# Patient Record
Sex: Female | Born: 1955 | Race: White | Hispanic: No | Marital: Married | Smoking: Never smoker
Health system: Southern US, Community
[De-identification: ages and names within clinical notes are randomized; demographics above are authoritative.]

## PROBLEM LIST (undated history)

## (undated) DIAGNOSIS — E119 Type 2 diabetes mellitus without complications: Secondary | ICD-10-CM

## (undated) DIAGNOSIS — I10 Essential (primary) hypertension: Secondary | ICD-10-CM

## (undated) DIAGNOSIS — Z1239 Encounter for other screening for malignant neoplasm of breast: Secondary | ICD-10-CM

## (undated) DIAGNOSIS — J4520 Mild intermittent asthma, uncomplicated: Secondary | ICD-10-CM

## (undated) DIAGNOSIS — L930 Discoid lupus erythematosus: Secondary | ICD-10-CM

## (undated) DIAGNOSIS — Z1231 Encounter for screening mammogram for malignant neoplasm of breast: Secondary | ICD-10-CM

## (undated) DIAGNOSIS — R399 Unspecified symptoms and signs involving the genitourinary system: Secondary | ICD-10-CM

## (undated) DIAGNOSIS — R0602 Shortness of breath: Secondary | ICD-10-CM

## (undated) DIAGNOSIS — M797 Fibromyalgia: Secondary | ICD-10-CM

## (undated) DIAGNOSIS — E559 Vitamin D deficiency, unspecified: Secondary | ICD-10-CM

## (undated) DIAGNOSIS — M329 Systemic lupus erythematosus, unspecified: Secondary | ICD-10-CM

## (undated) DIAGNOSIS — K5090 Crohn's disease, unspecified, without complications: Secondary | ICD-10-CM

## (undated) DIAGNOSIS — IMO0002 Reserved for concepts with insufficient information to code with codable children: Secondary | ICD-10-CM

## (undated) HISTORY — DX: Systemic lupus erythematosus, unspecified: M32.9

## (undated) HISTORY — PX: VASCULAR SURGERY: SHX849

## (undated) HISTORY — PX: ABDOMINAL SURGERY: SHX537

---

## 2006-07-23 ENCOUNTER — Ambulatory Visit: Payer: Self-pay | Admitting: Internal Medicine

## 2006-07-23 LAB — CONVERTED CEMR LAB
BUN: 15 mg/dL (ref 6–23)
Basophils Absolute: 0.1 10*3/uL (ref 0.0–0.1)
Basophils Relative: 0.8 % (ref 0.0–1.0)
CO2: 26 meq/L (ref 19–32)
Calcium: 9.3 mg/dL (ref 8.4–10.5)
Chloride: 103 meq/L (ref 96–112)
Creatinine, Ser: 0.7 mg/dL (ref 0.4–1.2)
Eosinophil percent: 2.5 % (ref 0.0–5.0)
GFR calc non Af Amer: 95 mL/min
Glomerular Filtration Rate, Af Am: 115 mL/min/{1.73_m2}
Glucose, Bld: 93 mg/dL (ref 70–99)
HCT: 38.4 % (ref 36.0–46.0)
Hemoglobin: 12.7 g/dL (ref 12.0–15.0)
Hgb A1c MFr Bld: 5.9 % (ref 4.6–6.0)
Lymphocytes Relative: 23.1 % (ref 12.0–46.0)
MCHC: 33.1 g/dL (ref 30.0–36.0)
MCV: 94.4 fL (ref 78.0–100.0)
Monocytes Absolute: 0.3 10*3/uL (ref 0.2–0.7)
Monocytes Relative: 4.3 % (ref 3.0–11.0)
Neutro Abs: 5.6 10*3/uL (ref 1.4–7.7)
Neutrophils Relative %: 69.3 % (ref 43.0–77.0)
Platelets: 318 10*3/uL (ref 150–400)
Potassium: 4.1 meq/L (ref 3.5–5.1)
RBC: 4.07 M/uL (ref 3.87–5.11)
RDW: 12.7 % (ref 11.5–14.6)
Sodium: 138 meq/L (ref 135–145)
TSH: 1.14 microintl units/mL (ref 0.35–5.50)
WBC: 8 10*3/uL (ref 4.5–10.5)

## 2006-09-23 ENCOUNTER — Ambulatory Visit: Payer: Self-pay | Admitting: Internal Medicine

## 2006-09-23 LAB — CONVERTED CEMR LAB
ALT: 20 units/L (ref 0–40)
AST: 19 units/L (ref 0–37)
Albumin: 4 g/dL (ref 3.5–5.2)
Alkaline Phosphatase: 49 units/L (ref 39–117)
BUN: 13 mg/dL (ref 6–23)
Bilirubin, Direct: 0.1 mg/dL (ref 0.0–0.3)
CO2: 25 meq/L (ref 19–32)
Calcium: 9.1 mg/dL (ref 8.4–10.5)
Chloride: 104 meq/L (ref 96–112)
Chol/HDL Ratio, serum: 4.4
Cholesterol: 229 mg/dL (ref 0–200)
Creatinine, Ser: 0.5 mg/dL (ref 0.4–1.2)
GFR calc non Af Amer: 140 mL/min
Glomerular Filtration Rate, Af Am: 169 mL/min/{1.73_m2}
Glucose, Bld: 72 mg/dL (ref 70–99)
HDL: 51.9 mg/dL (ref >39.0)
LDL DIRECT: 152.4 mg/dL
Potassium: 4.1 meq/L (ref 3.5–5.1)
Sodium: 137 meq/L (ref 135–145)
Total Bilirubin: 0.6 mg/dL (ref 0.3–1.2)
Total Protein: 6.2 g/dL (ref 6.0–8.3)
Triglyceride fasting, serum: 221 mg/dL (ref 0–149)
VLDL: 44 mg/dL — ABNORMAL HIGH (ref 0–40)

## 2006-10-09 ENCOUNTER — Ambulatory Visit: Payer: Self-pay

## 2006-11-04 ENCOUNTER — Ambulatory Visit: Payer: Self-pay | Admitting: Internal Medicine

## 2006-11-27 ENCOUNTER — Ambulatory Visit (HOSPITAL_BASED_OUTPATIENT_CLINIC_OR_DEPARTMENT_OTHER): Admission: RE | Admit: 2006-11-27 | Discharge: 2006-11-27 | Payer: Self-pay | Admitting: Internal Medicine

## 2006-12-09 ENCOUNTER — Ambulatory Visit: Payer: Self-pay | Admitting: Pulmonary Disease

## 2006-12-21 DIAGNOSIS — F329 Major depressive disorder, single episode, unspecified: Secondary | ICD-10-CM

## 2006-12-21 DIAGNOSIS — F411 Generalized anxiety disorder: Secondary | ICD-10-CM | POA: Insufficient documentation

## 2006-12-21 DIAGNOSIS — E039 Hypothyroidism, unspecified: Secondary | ICD-10-CM | POA: Insufficient documentation

## 2006-12-22 ENCOUNTER — Telehealth: Payer: Self-pay | Admitting: Internal Medicine

## 2007-03-12 ENCOUNTER — Encounter: Admission: RE | Admit: 2007-03-12 | Discharge: 2007-03-12 | Payer: Self-pay | Admitting: Obstetrics and Gynecology

## 2007-03-16 DIAGNOSIS — K5090 Crohn's disease, unspecified, without complications: Secondary | ICD-10-CM | POA: Insufficient documentation

## 2007-07-16 ENCOUNTER — Other Ambulatory Visit: Admission: RE | Admit: 2007-07-16 | Discharge: 2007-07-16 | Payer: Self-pay | Admitting: Obstetrics and Gynecology

## 2007-08-18 ENCOUNTER — Encounter: Admission: RE | Admit: 2007-08-18 | Discharge: 2007-08-18 | Payer: Self-pay | Admitting: Obstetrics and Gynecology

## 2007-12-09 ENCOUNTER — Emergency Department (HOSPITAL_COMMUNITY): Admission: EM | Admit: 2007-12-09 | Discharge: 2007-12-09 | Payer: Self-pay | Admitting: Emergency Medicine

## 2007-12-10 ENCOUNTER — Ambulatory Visit (HOSPITAL_COMMUNITY): Admission: RE | Admit: 2007-12-10 | Discharge: 2007-12-10 | Payer: Self-pay | Admitting: Emergency Medicine

## 2007-12-18 ENCOUNTER — Encounter: Admission: RE | Admit: 2007-12-18 | Discharge: 2007-12-18 | Payer: Self-pay | Admitting: Gastroenterology

## 2007-12-23 ENCOUNTER — Encounter: Admission: RE | Admit: 2007-12-23 | Discharge: 2007-12-23 | Payer: Self-pay | Admitting: Gastroenterology

## 2007-12-28 ENCOUNTER — Encounter: Admission: RE | Admit: 2007-12-28 | Discharge: 2008-03-27 | Payer: Self-pay | Admitting: Endocrinology

## 2008-01-20 ENCOUNTER — Ambulatory Visit (HOSPITAL_COMMUNITY): Admission: RE | Admit: 2008-01-20 | Discharge: 2008-01-20 | Payer: Self-pay | Admitting: Gastroenterology

## 2008-03-22 ENCOUNTER — Encounter: Admission: RE | Admit: 2008-03-22 | Discharge: 2008-06-07 | Payer: Self-pay | Admitting: Endocrinology

## 2008-07-08 ENCOUNTER — Encounter: Admission: RE | Admit: 2008-07-08 | Discharge: 2008-07-08 | Payer: Self-pay | Admitting: Gastroenterology

## 2008-08-18 ENCOUNTER — Encounter: Admission: RE | Admit: 2008-08-18 | Discharge: 2008-08-18 | Payer: Self-pay | Admitting: Obstetrics and Gynecology

## 2008-09-01 IMAGING — NM NM HEPATO W/GB/PHARM/[PERSON_NAME]
3 series · 8 of 8 positions shown · non-contrast
Comparison: None.

CLINICAL DATA: Abdominal pain radiating to the shoulders.  Nausea.

NUCLEAR MEDICINE HEPATOBILIARY IMAGING WITH/WITHOUT GALLBLADDER
EJECTION FRACTION.
TECHNIQUE: Sequential abdominal images were obtained following
intravenous injection of radiopharmaceutical.  Sequential images
were continued following oral ingestion of 8 oz. half-and-half, and
the gallbladder ejection fraction was calculated.
Radiopharmaceutical: 5.0 mCi technetium 99m Choletec

[gb hepatobiliary · 4.66mm/px · 6 of 12 frames shown (1 of 3)]
[frame 2/12]
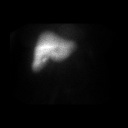
[frame 4/12]
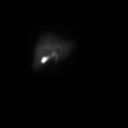
[frame 6/12]
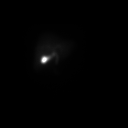
[frame 8/12]
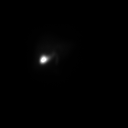
[frame 10/12]
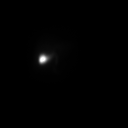
[frame 12/12]
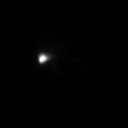

[gb hepatobiliary · 1 of 1 slices shown (2 of 3)]
[im 1/1]
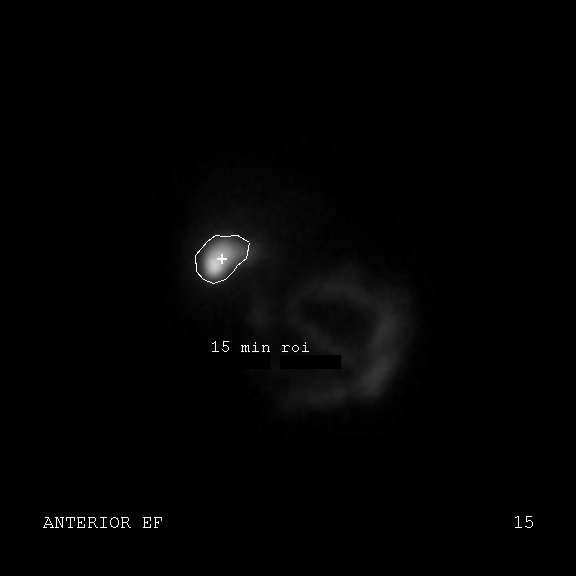

[gb hepatobiliary · 1 of 1 slices shown (3 of 3)]
[im 1/1]
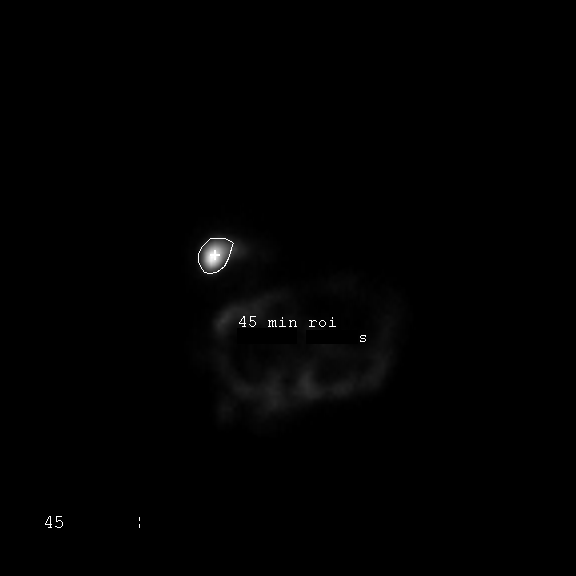

[8 of 8 positions shown; findings below may reference images not displayed]

FINDINGS: There is brisk uptake of radiotracer by the hepatic
sites.  Blood pool activity has essentially been cleared by 10
minutes.  Biliary, common duct, and gallbladder activity is seen on
the 10-minute film.  Bowel activity is seen by 20 minutes.

Region of interest measurements were obtained at the level of the
gallbladder and monitored after administration of oral half-and-
half.  At 45 minutes, gallbladder ejection fraction had achieved
69%.
IMPRESSION: Normal hepatobiliary patency scan.

Normal gallbladder ejection fraction.

## 2009-04-06 ENCOUNTER — Emergency Department (HOSPITAL_COMMUNITY): Admission: EM | Admit: 2009-04-06 | Discharge: 2009-04-06 | Payer: Self-pay | Admitting: Emergency Medicine
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
## 2011-02-01 NOTE — Procedures (Signed)
Amanda Gilbert, PETRENKO NO.:  [id number]   MEDICAL RECORD NO.:  [id number]          PATIENT TYPE:  OUT   LOCATION:  SLEEP CENTER                 FACILITY:  [facility]   PHYSICIAN:  Barbaraann Share, MD,FCCPDATE OF BIRTH:  1958-03-[birth year]   DATE OF STUDY:  11/27/2006                            NOCTURNAL POLYSOMNOGRAM   LOCATION:  Sleep lab.   REFERRING PHYSICIAN:  Valetta Mole. Swords, M.D.   INDICATION FOR STUDY:  Other sleep disturbance.  Also, persistent  disorder of initiating and maintaining wakefulness.   EPWORTH SLEEPINESS SCORE:  13.   MEDICATIONS:   SLEEP ARCHITECTURE:  The patient had a total sleep time of 381 minutes  with significantly decreased slow wave sleep  but adequate REM.  Sleep  onset latency was at the upper limits of normal.  REM onset was  prolonged at 208 minutes.  Sleep efficiency was surprisingly adequate at  94%.   RESPIRATORY DATA:  Patient was found to have 6 obstructive hypopneas and  2 obstructive apneas for an apnea-hypopnea index of only 1.3 events per  hour.  There was loud to very loud snoring noted throughout as well as  very large numbers of nonspecific arousals.  Split night study was not  done secondary to the small numbers of events.   OXYGEN DATA:  There was an O2 desaturation as low as 89% with the  patient's obstructive events.   CARDIAC DATA:  Rare PVCs noted but none of clinical significance.   MOVEMENT-PARASOMNIA:  The patient was found to have 259 leg jerks with 8  per hour, resulting in arousal or awakening.   IMPRESSIONS-RECOMMENDATIONS:  1. Small numbers of obstructive events which do not meet the apnea-      hypopnea index criteria for the obstructive sleep apnea syndrome.      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  This is a pre sleep apnea condition, which can      also disrupt sleep.  2.  Very large numbers of leg jerks with significant sleep disruption.      Given the small numbers of obstructive events, this is likely due      to a primary movement disorder of sleep, such as the restless legs      syndrome or      periodic leg movement syndrome.  Clinical correlation is suggested      to exclude other etiologies for this.  This may be the patient's      primary disruption to her sleep.      Barbaraann Share, MD,FCCP  Diplomate, American Board of Sleep  Medicine  Electronically Signed     [facility]/MEDQ  D:  12/09/2006 18:46:02  T:  12/10/2006 01:46:43  Job:  [id number]

## 2011-06-10 LAB — COMPREHENSIVE METABOLIC PANEL
AST: 20
Albumin: 3.6
Alkaline Phosphatase: 45
Chloride: 105
GFR calc Af Amer: >60
Potassium: 3.7

## 2011-06-10 LAB — DIFFERENTIAL
Basophils Relative: 0
Eosinophils Absolute: 0.2
Lymphs Abs: 1.5
Monocytes Absolute: 0.6
Monocytes Relative: 7
Neutro Abs: 6.6

## 2011-06-10 LAB — CBC
HCT: 34.9 — ABNORMAL LOW
MCV: 92.9
WBC: 8.9

## 2011-06-10 LAB — URINALYSIS, ROUTINE W REFLEX MICROSCOPIC
Protein, ur: NEGATIVE
Specific Gravity, Urine: 1.023
Urobilinogen, UA: 1

## 2011-06-10 LAB — LIPASE, BLOOD: Lipase: 22

## 2014-07-22 ENCOUNTER — Ambulatory Visit: Attending: Family Medicine

## 2014-07-22 ENCOUNTER — Ambulatory Visit: Admit: 2014-07-22 | Discharge: 2014-07-22 | Payer: PRIVATE HEALTH INSURANCE | Attending: Family Medicine

## 2014-07-22 DIAGNOSIS — E119 Type 2 diabetes mellitus without complications: Secondary | ICD-10-CM

## 2014-07-22 MED ORDER — HYDROXYCHLOROQUINE 200 MG TAB
200 mg | ORAL_TABLET | Freq: Two times a day (BID) | ORAL | Status: DC
Start: 2014-07-22 — End: 2014-12-31

## 2014-07-22 MED ORDER — LISINOPRIL 40 MG TAB
40 mg | ORAL_TABLET | Freq: Every day | ORAL | Status: DC
Start: 2014-07-22 — End: 2015-04-14

## 2014-07-22 MED ORDER — METFORMIN SR 500 MG 24 HR TABLET
500 mg | ORAL_TABLET | ORAL | Status: DC
Start: 2014-07-22 — End: 2014-12-01

## 2014-07-22 MED ORDER — PRAMIPEXOLE 0.25 MG TAB
0.25 mg | ORAL_TABLET | Freq: Two times a day (BID) | ORAL | Status: DC
Start: 2014-07-22 — End: 2015-02-11

## 2014-07-22 MED ORDER — CITALOPRAM 20 MG TAB
20 mg | ORAL_TABLET | Freq: Every day | ORAL | Status: DC
Start: 2014-07-22 — End: 2014-10-18

## 2014-07-22 MED ORDER — ACETIC ACID 2 % EAR SOLN
2 % | Freq: Three times a day (TID) | OTIC | Status: AC
Start: 2014-07-22 — End: 2014-07-29

## 2014-07-22 MED ORDER — HYDROCHLOROTHIAZIDE 25 MG TAB
25 mg | ORAL_TABLET | Freq: Every day | ORAL | Status: DC
Start: 2014-07-22 — End: 2015-04-14

## 2014-07-22 NOTE — Progress Notes (Signed)
Reviewed pt information and medications in preparation for today's visit.  Body mass index is 36.51 kg/(m^2).

## 2014-07-22 NOTE — Progress Notes (Signed)
Chief Complaint   Patient presents with   ??? Establish Care   ??? Skin Problem     Itchy ears   ??? Medication Refill     she is a 58 y.o. year old female new to practice who presents to establish care. Patient with hx of DM2, HTN, autoimmune disease, restless leg syndrome and anxiety. Says diabetes has been well controlled, cholesterol "borderline." Has hx of allergies and complains of b/l ear irritation. Patient has been on high dose celexa for many years. She would like to decrease dose of this medication. Has tried to do so on her own with adverse effects. Release signed for medical records.    Patient Active Problem List   Diagnosis Code   ??? Diabetes mellitus type 2, controlled (HCC) E11.9   ??? Essential hypertension I10     Past Surgical History   Procedure Laterality Date   ??? Hx cesarean section  1989/1991   ??? Hx dilation and curettage  2013     History     Social History   ??? Marital Status: MARRIED     Spouse Name: N/A     Number of Children: N/A   ??? Years of Education: N/A     Occupational History   ??? Not on file.     Social History Main Topics   ??? Smoking status: Never Smoker    ??? Smokeless tobacco: Never Used   ??? Alcohol Use: 0.0 oz/week     0 Glasses of wine per week   ??? Drug Use: No   ??? Sexual Activity: Not on file     Other Topics Concern   ??? Not on file     Social History Narrative   ??? No narrative on file     Family History   Problem Relation Age of Onset   ??? Hypertension Mother    ??? Hypertension Father      Current Outpatient Prescriptions   Medication Sig   ??? metFORMIN ER (GLUCOPHAGE XR) 500 mg tablet Take two tablets twice daily   ??? hydrochlorothiazide (HYDRODIURIL) 25 mg tablet Take 1 Tab by mouth daily.   ??? lisinopril (PRINIVIL, ZESTRIL) 40 mg tablet Take 1 Tab by mouth daily.   ??? citalopram (CELEXA) 20 mg tablet Take 3 Tabs by mouth daily. Take 3 tablets by mouth once daily   ??? pramipexole (MIRAPEX) 0.25 mg tablet Take 1 Tab by mouth two (2) times a day. Indications: RESTLESS LEGS SYNDROME    ??? hydroxychloroquine (PLAQUENIL) 200 mg tablet Take 1 Tab by mouth two (2) times a day.   ??? acetic acid (VOSOL) 2 % otic solution Administer 4 Drops into each ear three (3) times daily for 7 days.     No current facility-administered medications for this visit.     Allergies   Allergen Reactions   ??? Reglan [Metoclopramide] Other (comments)     Spontaneous arm movements   ??? Diflucan [Fluconazole] Other (comments)     Elevated liver enzymes       Review of Systems:  Constitutional: feeling well, denies fatigue, malaise  HEENT: see HPI, Negative for acute hearing or vision changes  Respiratory: Negative for cough, wheezing or SOB  Cardiovascular: Negative for chest pain, dizziness or palpitations  Gastrointestinal: Negative for nausea or abdominal pain  Genital/urinary: Negative for dysuria or voiding dysfunction  Musculoskeletal: Negative for acute myalgia or arthralgia   Neurological: see HPI, Negative for HA, weakness or paresthesia  Skin: Negative for rash or  lesion  Psychlogical: Negative for depression or anxiety     Filed Vitals:    07/22/14 0915   BP: 119/48   Pulse: 87   Temp: 98.6 ??F (37 ??C)   TempSrc: Oral   Resp: 18   Height: 5\' 4"  (1.626 m)   Weight: 212 lb 12.8 oz (96.525 kg)   SpO2: 96%       Physical Examination:  General: Well developed, well nourished, in no acute distress  Head: Normocephalic, atraumatic  Eyes: Sclera clear, EOMI  Neck: Normal range of motion  Respiratory: Clear to auscultation bilaterally with symmetrical effort  Cardiovascular: Normal S1, S2, Regular rate and rhythm  Extremities: Full range of motion, Normal gait  Neurological: Normal strength and sensation. No focal deficits  Skin: Warm and dry, normal tone and turgor  Psychological: Active, alert and oriented. Affect appropriate     Angela Elliott was seen today for establish care, skin problem and medication refill.    Diagnoses and all orders for this visit:    Diabetes mellitus type 2, controlled (HCC)  Orders:   -     metFORMIN ER (GLUCOPHAGE XR) 500 mg tablet; Take two tablets twice daily  -     METABOLIC PANEL, COMPREHENSIVE; Future  -     LIPID PANEL; Future  -     HEMOGLOBIN A1C; Future  -     CBC W/O DIFF; Future  -     AMB POC URINALYSIS DIP STICK AUTO W/O MICRO  -     AMB POC URINE, MICROALBUMIN, SEMIQUANT (3 RESULTS)    Essential hypertension  Orders:  -     hydrochlorothiazide (HYDRODIURIL) 25 mg tablet; Take 1 Tab by mouth daily.  -     lisinopril (PRINIVIL, ZESTRIL) 40 mg tablet; Take 1 Tab by mouth daily.  -     METABOLIC PANEL, COMPREHENSIVE; Future  -     TSH, 3RD GENERATION; Future    Anxiety  Orders:  -     citalopram (CELEXA) 20 mg tablet; Take 3 Tabs by mouth daily. Take 3 tablets by mouth once daily    Restless leg syndrome  Orders:  -     pramipexole (MIRAPEX) 0.25 mg tablet; Take 1 Tab by mouth two (2) times a day. Indications: RESTLESS LEGS SYNDROME    Autoimmune disorder (HCC)  Orders:  -     hydroxychloroquine (PLAQUENIL) 200 mg tablet; Take 1 Tab by mouth two (2) times a day.    Otitis externa, left  Orders:  -     acetic acid (VOSOL) 2 % otic solution; Administer 4 Drops into each ear three (3) times daily for 7 days.     Will refer to rheumatology when records available.  Start to decrease celexa to 2 1/2 tabs every 3rd day for 10 days.  Then every other day for 10 days. Then daily until follow up appointment  I have discussed the diagnosis with the patient and the intended plan as seen in the above orders.The patient expresses understanding and agreement with our plan of care. All of the patient's questions were answered to apparent satisfaction. The patient has received an after-visit summary. The patient knows to call our office if there are any questions or concerns regarding diagnosis and treatment plans. I have discussed medication side effects and warnings with the patient as well.  Over half of the 45 minutes face to face with Milda Smartathy K Rayner consisted  of counseling, review of medical history, medication and discussing treatment  plans in reference to her multiple co-morbidities..      Follow-up Disposition:  Return in about 4 weeks (around 08/19/2014).

## 2014-07-22 NOTE — Patient Instructions (Signed)
Start to decrease celexa to 2 1/2 tabs every 3rd day for 10 days.  Then every other day for 10 days. Then daily until follow up appointment  Nutrition Tips for Diabetes: After Your Visit  Your Care Instructions  A healthy diet is important to manage diabetes. It helps you lose weight (if you need to) and keep it off. It gives you the nutrition and energy your body needs and helps prevent heart disease. But a diet for diabetes does not mean that you have to eat special foods. You can eat what your family eats, including occasional sweets and other favorites. But you do have to pay attention to how often you eat and how much you eat of certain foods. The right plan for you will give you meals that help you keep your blood sugar at healthy levels.  Try to eat a variety of foods and to spread carbohydrate throughout the day. Carbohydrate raises blood sugar higher and more quickly than any other nutrient does. Carbohydrate is found in sugar, breads and cereals, fruit, starchy vegetables such as potatoes and corn, and milk and yogurt.  You may want to work with a dietitian or diabetes educator to help you plan meals and snacks. A dietitian or diabetes educator also can help you lose weight if that is one of your goals. The following tips can help you enjoy your meals and stay healthy.  Follow-up care is a key part of your treatment and safety. Be sure to make and go to all appointments, and call your doctor if you are having problems. It???s also a good idea to know your test results and keep a list of the medicines you take.  How can you care for yourself at home?  ?? Learn which foods have carbohydrate and how much carbohydrate to eat. A dietitian or diabetes educator can help you learn to keep track of how much carbohydrate you eat.  ?? Spread carbohydrate throughout the day. Eat some carbohydrate at all meals, but do not eat too much at any one time.   ?? Plan meals to include food from all the food groups. These are the food groups and some example portion sizes:  ?? Grains: 1 slice of bread (1 ounce), ?? cup of cooked cereal, and 1/3 cup of cooked pasta or rice. These have about 15 grams of carbohydrate in a serving. Choose whole grains such as whole wheat bread or crackers, oatmeal, and brown rice more often than refined grains.  ?? Fruit: 1 small fresh fruit, such as an apple or orange; ?? of a banana; ?? cup of chopped, cooked, or canned fruit; ?? cup of fruit juice; 1 cup of melon or raspberries; and 2 tablespoons of dried fruit. These have about 15 grams of carbohydrate in a serving.  ?? Dairy: 1 cup of nonfat or low-fat milk and 2/3 cup of plain yogurt. These have about 15 grams of carbohydrate in a serving.  ?? Protein foods: Beef, chicken, [place], fish, eggs, tofu, cheese, cottage cheese, and peanut butter. A serving size of meat is 3 ounces, which is about the size of a deck of cards. Examples of meat substitute serving sizes (equal to 1 ounce of meat) are 1/4 cup of cottage cheese, 1 egg, 1 tablespoon of peanut butter, and ?? cup of tofu. These have very little or no carbohydrate per serving.  ?? Vegetables: Starchy vegetables such as ?? cup of cooked dried beans, peas, potatoes, or corn have about 15 grams of  carbohydrate. Nonstarchy vegetables have very little carbohydrate, such as 1 cup of raw leafy vegetables (such as spinach), ?? cup of other vegetables (cooked or chopped), and 3/4 cup of vegetable juice.  ?? Use the plate format to plan meals. It is a good, quick way to make sure that you have a balanced meal. It also helps you spread carbohydrate throughout the day. You divide your plate by types of foods. Put vegetables on half the plate, meat or meat substitutes on one-quarter of the plate, and a grain or starchy vegetable (such as brown rice or a potato) in the final quarter of the plate. To this you can add a small  piece of fruit and 1 cup of milk or yogurt, depending on how much carbohydrate you are supposed to eat at a meal.  ?? Talk to your dietitian or diabetes educator about ways to add limited amounts of sweets into your meal plan. You can eat these foods now and then, as long as you include the amount of carbohydrate they have in your daily carbohydrate allowance.  ?? If you drink alcohol, limit it to no more than 1 drink a day for women and 2 drinks a day for men. If you are pregnant, no amount of alcohol is known to be safe.  ?? Protein, fat, and fiber do not raise blood sugar as much as carbohydrate does. If you eat a lot of these nutrients in a meal, your blood sugar will rise more slowly than it would otherwise.  ?? Limit saturated fats, such as those from meat and dairy products. Try to replace it with monounsaturated fat, such as olive oil. This is a healthier choice because people who have diabetes are at higher-than-average risk of heart disease. But use a modest amount of olive oil. A tablespoon of olive oil has 14 grams of fat and 120 calories.  ?? Exercise lowers blood sugar. If you take insulin by shots or pump, you can use less than you would if you were not exercising. Keep in mind that timing matters. If you exercise within 1 hour after a meal, your body may need less insulin for that meal than it would if you exercised 3 hours after the meal. Test your blood sugar to find out how exercise affects your need for insulin.  ?? Exercise on most days of the week. Aim for at least 30 minutes. Exercise helps you stay at a healthy weight and helps your body use insulin. Walking is an easy way to get exercise. Gradually increase the amount you walk every day. You also may want to swim, bike, or do other activities.  When you eat out  ?? Learn to estimate the serving sizes of foods that have carbohydrate. If you measure food at home, it will be easier to estimate the amount in a serving of restaurant food.   ?? If the meal you order has too much carbohydrate (such as potatoes, corn, or baked beans), ask to have a low-carbohydrate food instead. Ask for a salad or green vegetables.  ?? If you use insulin, check your blood sugar before and after eating out to help you plan how much to eat in the future.  ?? If you eat more carbohydrate at a meal than you had planned, take a walk or do other exercise. This will help lower your blood sugar.   Where can you learn more?   Go to [employer]  Enter [phone] in the search box to learn more  about "Nutrition Tips for Diabetes: After Your Visit."   ?? 2006-2014 Healthwise, Incorporated. Care instructions adapted under license by [employer] (which disclaims liability or warranty for this information). This care instruction is for use with your licensed healthcare professional. If you have questions about a medical condition or this instruction, always ask your healthcare professional. [place] any warranty or liability for your use of this information.  Content Version: 10.2.346038; Current as of: February 17, 2013

## 2014-07-25 ENCOUNTER — Encounter

## 2014-07-27 ENCOUNTER — Ambulatory Visit: Admit: 2014-07-27 | Discharge: 2014-07-27 | Payer: PRIVATE HEALTH INSURANCE | Attending: Family Medicine

## 2014-07-27 DIAGNOSIS — J019 Acute sinusitis, unspecified: Secondary | ICD-10-CM

## 2014-07-27 MED ORDER — MOMETASONE 50 MCG/ACTUATION NASAL SPRAY
50 mcg/actuation | Freq: Every day | NASAL | Status: DC
Start: 2014-07-27 — End: 2014-08-15

## 2014-07-27 MED ORDER — AMOXICILLIN 875 MG TAB
875 mg | ORAL_TABLET | Freq: Two times a day (BID) | ORAL | Status: AC
Start: 2014-07-27 — End: 2014-08-06

## 2014-07-27 NOTE — Patient Instructions (Signed)
Sinusitis: After Your Visit  Your Care Instructions     Sinusitis is an infection of the lining of the sinus cavities in your head. Sinusitis often follows a cold. It causes pain and pressure in your head and face.  In most cases, sinusitis gets better on its own in 1 to 2 weeks. But some mild symptoms may last for several weeks. Sometimes antibiotics are needed.  Follow-up care is a key part of your treatment and safety. Be sure to make and go to all appointments, and call your doctor if you are having problems. It's also a good idea to know your test results and keep a list of the medicines you take.  How can you care for yourself at home?  ?? Take an over-the-counter pain medicine, such as acetaminophen (Tylenol), ibuprofen (Advil, Motrin), or naproxen (Aleve). Read and follow all instructions on the label.  ?? If the doctor prescribed antibiotics, take them as directed. Do not stop taking them just because you feel better. You need to take the full course of antibiotics.  ?? Be careful when taking over-the-counter cold or flu medicines and Tylenol at the same time. Many of these medicines have acetaminophen, which is Tylenol. Read the labels to make sure that you are not taking more than the recommended dose. Too much acetaminophen (Tylenol) can be harmful.  ?? Breathe warm, moist air from a steamy shower, a hot bath, or a sink filled with hot water. Avoid cold, dry air. Using a humidifier in your home may help. Follow the directions for cleaning the machine.  ?? Use saline (saltwater) nasal washes to help keep your nasal passages open and wash out mucus and bacteria. You can buy saline nose drops at a grocery store or drugstore. Or you can make your own at home by adding 1 teaspoon of salt and 1 teaspoon of baking soda to 2 cups of distilled water. If you make your own, fill a bulb syringe with the solution, insert the tip into your nostril, and squeeze gently. Blow your nose.   ?? Put a hot, wet towel or a warm gel pack on your face 3 or 4 times a day for 5 to 10 minutes each time.  ?? Try a decongestant nasal spray like oxymetazoline (Afrin). Do not use it for more than 3 days in a row. Using it for more than 3 days can make your congestion worse.  When should you call for help?  Call your doctor now or seek immediate medical care if:  ?? You have new or worse swelling or redness in your face or around your eyes.  ?? You have a new or higher fever.  Watch closely for changes in your health, and be sure to contact your doctor if:  ?? You have new or worse facial pain.  ?? The mucus from your nose becomes thicker (like pus) or has new blood in it.  ?? You are not getting better as expected.   Where can you learn more?   Go to http://www.healthwise.net/[facility]  Enter I933 in the search box to learn more about "Sinusitis: After Your Visit."   ?? 2006-2015 Healthwise, Incorporated. Care instructions adapted under license by [employer] (which disclaims liability or warranty for this information). This care instruction is for use with your licensed healthcare professional. If you have questions about a medical condition or this instruction, always ask your healthcare professional. Healthwise, Incorporated disclaims any warranty or liability for your use of this information.    Content Version: 10.5.422740; Current as of: July 30, 2013

## 2014-07-27 NOTE — Progress Notes (Signed)
Reviewed record in preparation for visit and have obtained necessary documentation.  Body mass index is 36.54 kg/(m^2).  Sinusitis printed with AVS

## 2014-07-31 NOTE — Progress Notes (Signed)
Chief Complaint   Patient presents with   ??? Sinus Infection     pressure across her face     Angela Elliott is a 58 y.o. female presents with complaints of congestion, post nasal drip, dry cough and bilateral, lower sinus pain for several days.  There has been no nausea and no vomiting . she has not had  swollen glands, myalgias and fever. Symptoms are moderate. Patient is drinking plenty of fluids. There is not a hx of asthma. There is not a hx of allergic rhinitis. There is not a hx of tobacco use. There have not been contacts with similar infections.    Medications:     Current Outpatient Prescriptions   Medication Sig   ??? amoxicillin (AMOXIL) 875 mg tablet Take 1 Tab by mouth two (2) times a day for 10 days.   ??? mometasone (NASONEX) 50 mcg/actuation nasal spray 2 Sprays by Both Nostrils route daily.   ??? metFORMIN ER (GLUCOPHAGE XR) 500 mg tablet Take two tablets twice daily   ??? hydrochlorothiazide (HYDRODIURIL) 25 mg tablet Take 1 Tab by mouth daily.   ??? lisinopril (PRINIVIL, ZESTRIL) 40 mg tablet Take 1 Tab by mouth daily.   ??? citalopram (CELEXA) 20 mg tablet Take 3 Tabs by mouth daily. Take 3 tablets by mouth once daily   ??? pramipexole (MIRAPEX) 0.25 mg tablet Take 1 Tab by mouth two (2) times a day. Indications: RESTLESS LEGS SYNDROME   ??? hydroxychloroquine (PLAQUENIL) 200 mg tablet Take 1 Tab by mouth two (2) times a day.     No current facility-administered medications for this visit.       Problem List:     Patient Active Problem List    Diagnosis Date Noted   ??? Diabetes mellitus type 2, controlled (HCC) 07/22/2014   ??? Essential hypertension 07/22/2014       Medical History:     Past Medical History   Diagnosis Date   ??? Hypertension    ??? Diabetes (HCC)    ??? Restless leg syndrome    ??? Anxiety    ??? Lupus (HCC)        Allergies:     Allergies   Allergen Reactions   ??? Reglan [Metoclopramide] Other (comments)     Spontaneous arm movements   ??? Diflucan [Fluconazole] Other (comments)      Elevated liver enzymes       Surgical History:     Past Surgical History   Procedure Laterality Date   ??? Hx cesarean section  1989/1991   ??? Hx dilation and curettage  2013       Social History:     History     Social History   ??? Marital Status: MARRIED     Spouse Name: N/A     Number of Children: N/A   ??? Years of Education: N/A     Social History Main Topics   ??? Smoking status: Never Smoker    ??? Smokeless tobacco: Never Used   ??? Alcohol Use: 0.0 oz/week     0 Glasses of wine per week   ??? Drug Use: No   ??? Sexual Activity: Not on file     Other Topics Concern   ??? Not on file     Social History Narrative       Review of Symptoms:  Constitutional: c/o malaise, denies fever or chills  Head: negative for facial swelling or tenderness  Eyes: negative for redness or discharge  Ears: negative for otalgia or decreased hearing  Nose: c/o nasal congestion, denies sinus pressure  Neck: c/o sore throat, denies lymphadenopathy   Cardiovascular: negative for chest pain or palpitations  Respiratory: c/o non-productive cough, denies wheezing or SOB  Gastrointestinal: negative for nausea or abdominal pain  Neurologic: negative for headache or dizziness  Skin: negative for rash or lesion    Visit Vitals   Item Reading   ??? BP 100/57 mmHg   ??? Pulse 87   ??? Temp(Src) 96.7 ??F (35.9 ??C) (Oral)   ??? Resp 20   ??? Ht 5\' 4"  (1.626 m)   ??? Wt 213 lb (96.616 kg)   ??? BMI 36.54 kg/m2   ??? SpO2 99%       Physical Examination:  General: Well developed, well nourished, in no acute distress  Head: Normocephalic, atraumatic  Eyes: Sclera clear, EOMI, PERRL  Ears: tympanic membranes normal in appearance  Nose: mucosal edema with rhinorrhea  Oropharynx: posterior erythema, no exudate   Neck: Normal range of motion, no lymphadenopathy  Cardiovascular: normal S1, S2, regular rate and rhythm  Respiratory: Clear to auscultation bilaterally with symmetrical, unlabored effort  Extremities: Full range of motion  Neurologic: Active, alert and oriented   Skin: Warm and dry sans rash or lesion    Angela Elliott was seen today for sinus infection.    Diagnoses and all orders for this visit:    Acute rhinosinusitis    Other orders  -     amoxicillin (AMOXIL) 875 mg tablet; Take 1 Tab by mouth two (2) times a day for 10 days.  -     mometasone (NASONEX) 50 mcg/actuation nasal spray; 2 Sprays by Both Nostrils route daily.        Symptomatic therapy suggested: rest, increase fluids, apply heat to sinuses prn and call prn if symptoms persist or worsen.  I have discussed the diagnosis with the patient and the intended plan as seen in the above orders.The patient expresses understanding and agreement with our plan of care. All of the patient's questions were answered to apparent satisfaction. The patient has received an after-visit summary. The patient knows to call our office if there are any questions or concerns regarding diagnosis and treatment plans. I have discussed medication side effects and warnings with the patient as well.      Follow-up Disposition:  Return if symptoms worsen or fail to improve.

## 2014-08-01 NOTE — Telephone Encounter (Signed)
Patient states she has noticed swelling in the left ear and left side of her neck. Patient advised to come back in for reevaluation. Patient verbalized understanding. Appointment scheduled for tomorrow @ 9:40am.

## 2014-08-01 NOTE — Telephone Encounter (Signed)
Patient called and stated she was seen on Wednesday and given an antibiotic for an ear infection. She is now having swelling behind her left ear and down into her neck and is also experiencing pain.  Please advise at [phone].

## 2014-08-02 ENCOUNTER — Ambulatory Visit: Admit: 2014-08-02 | Discharge: 2014-08-02 | Payer: PRIVATE HEALTH INSURANCE | Attending: Family Medicine

## 2014-08-02 DIAGNOSIS — H9202 Otalgia, left ear: Secondary | ICD-10-CM

## 2014-08-02 MED ORDER — CETIRIZINE 10 MG TAB
10 mg | ORAL_TABLET | Freq: Every evening | ORAL | Status: DC
Start: 2014-08-02 — End: 2015-01-06

## 2014-08-02 MED ORDER — CLINDAMYCIN 150 MG CAP
150 mg | ORAL_CAPSULE | Freq: Three times a day (TID) | ORAL | Status: AC
Start: 2014-08-02 — End: 2014-08-12

## 2014-08-02 NOTE — Progress Notes (Signed)
Reviewed pt information and medications in preparation for today's visit.  Body mass index is 36.17 kg/(m^2).    1. Have you been to the ER, urgent care clinic since your last visit? No   Hospitalized since your last visit? No    2. Have you seen or consulted any other health care providers [facility] since your last visit?   Include any pap smears or colon screening. No    3. Do you have an advanced directive/living will in place? NO  Would you like a copy? NO

## 2014-08-02 NOTE — Patient Instructions (Signed)
Learning About Diabetes Food Guidelines  Your Care Instructions  Meal planning is important to manage diabetes. It helps keep your blood sugar at a target level (which you set with your doctor). You don't have to eat special foods. You can eat what your family eats, including sweets once in a while. But you do have to pay attention to how often you eat and how much you eat of certain foods.  You may want to work with a dietitian or a certified diabetes educator (CDE) to help you plan meals and snacks. A dietitian or CDE can also help you lose weight if that is one of your goals.  What should you know about eating carbs?  Managing the amount of carbohydrate (carbs) you eat is an important part of healthy meals when you have diabetes. Carbohydrate is found in many foods.  ?? Learn which foods have carbs. And learn the amounts of carbs in different foods.  ?? Bread, cereal, pasta, and rice have about 15 grams of carbs in a serving. A serving is 1 slice of bread (1 ounce), ?? cup of cooked cereal, or 1/3 cup of cooked pasta or rice.  ?? Fruits have 15 grams of carbs in a serving. A serving is 1 small fresh fruit, such as an apple or orange; ?? of a banana; ?? cup of cooked or canned fruit; ?? cup of fruit juice; 1 cup of melon or raspberries; or 2 tablespoons of dried fruit.  ?? Milk and no-sugar-added yogurt have 15 grams of carbs in a serving. A serving is 1 cup of milk or 2/3 cup of no-sugar-added yogurt.  ?? Starchy vegetables have 15 grams of carbs in a serving. A serving is ?? cup of mashed potatoes or sweet potato; 1 cup winter squash; ?? of a small baked potato; ?? cup of cooked beans; or ?? cup cooked corn or green peas.  ?? Learn how much carbs to eat each day and at each meal. A dietitian or CDE can teach you how to keep track of the amount of carbs you eat. This is called carbohydrate counting.  ?? If you are not sure how to count carbohydrate grams, use the Plate  Method to plan meals. It is a good, quick way to make sure that you have a balanced meal. It also helps you spread carbs throughout the day.  ?? Divide your plate by types of foods. Put non-starchy vegetables on half the plate, meat or other protein food on one-quarter of the plate, and a grain or starchy vegetable in the final quarter of the plate. To this you can add a small piece of fruit and 1 cup of milk or yogurt, depending on how many carbs you are supposed to eat at a meal.  ?? Try to eat about the same amount of carbs at each meal. Do not "save up" your daily allowance of carbs to eat at one meal.  ?? Proteins have very little or no carbs per serving. Examples of proteins are beef, chicken, turkey, fish, eggs, tofu, cheese, cottage cheese, and peanut butter. A serving size of meat is 3 ounces, which is about the size of a deck of cards. Examples of meat substitute serving sizes (equal to 1 ounce of meat) are 1/4 cup of cottage cheese, 1 egg, 1 tablespoon of peanut butter, and ?? cup of tofu.  How can you eat out and still eat healthy?  ?? Learn to estimate the serving sizes of foods that have   carbohydrate. If you measure food at home, it will be easier to estimate the amount in a serving of restaurant food.  ?? If the meal you order has too much carbohydrate (such as potatoes, corn, or baked beans), ask to have a low-carbohydrate food instead. Ask for a salad or green vegetables.  ?? If you use insulin, check your blood sugar before and after eating out to help you plan how much to eat in the future.  ?? If you eat more carbohydrate at a meal than you had planned, take a walk or do other exercise. This will help lower your blood sugar.  What else should you know?  ?? Limit saturated fat, such as the fat from meat and dairy products. This is a healthy choice because people who have diabetes are at higher risk of heart disease. So choose lean cuts of meat and nonfat or low-fat dairy  products. Use olive or canola oil instead of butter or shortening when cooking.  ?? Don't skip meals. Your blood sugar may drop too low if you skip meals and take insulin or certain medicines for diabetes.  ?? Check with your doctor before you drink alcohol. Alcohol can cause your blood sugar to drop too low. Alcohol can also cause a bad reaction if you take certain diabetes medicines.  Follow-up care is a key part of your treatment and safety. Be sure to make and go to all appointments, and call your doctor if you are having problems. It's also a good idea to know your test results and keep a list of the medicines you take.   Where can you learn more?   Go to http://www.healthwise.net/[facility]  Enter I147 in the search box to learn more about "Learning About Diabetes Food Guidelines."   ?? 2006-2015 [employer]. Care instructions adapted under license by [employer] (which disclaims liability or warranty for this information). This care instruction is for use with your licensed healthcare professional. If you have questions about a medical condition or this instruction, always ask your healthcare professional. Healthwise, Incorporated disclaims any warranty or liability for your use of this information.  Content Version: 10.5.422740; Current as of: October 28, 2013

## 2014-08-09 NOTE — Progress Notes (Signed)
Chief Complaint   Patient presents with   ??? Ear Pain   ??? Neck Pain     Neck pain and swelling   ??? Diarrhea     Angela Elliott is a 58 y.o. female presents with complaints of congestion, left ear pain and left neck edema for several days.  There has been no nausea and no vomiting. There has been diarrhea. she has not had  myalgias and fever. Symptoms are moderate. Patient is drinking plenty of fluids. There is not a hx of asthma. There is a hx of allergic rhinitis. There is not a hx of tobacco use. There have not been contacts with similar infections. Patient with hx of DM2 and HTN. Labs have been ordered.     Medications:     Current Outpatient Prescriptions   Medication Sig   ??? cetirizine (ZYRTEC) 10 mg tablet Take 1 Tab by mouth nightly.   ??? clindamycin (CLEOCIN) 150 mg capsule Take 1 Cap by mouth three (3) times daily for 10 days.   ??? mometasone (NASONEX) 50 mcg/actuation nasal spray 2 Sprays by Both Nostrils route daily.   ??? metFORMIN ER (GLUCOPHAGE XR) 500 mg tablet Take two tablets twice daily   ??? hydrochlorothiazide (HYDRODIURIL) 25 mg tablet Take 1 Tab by mouth daily.   ??? lisinopril (PRINIVIL, ZESTRIL) 40 mg tablet Take 1 Tab by mouth daily.   ??? citalopram (CELEXA) 20 mg tablet Take 3 Tabs by mouth daily. Take 3 tablets by mouth once daily   ??? pramipexole (MIRAPEX) 0.25 mg tablet Take 1 Tab by mouth two (2) times a day. Indications: RESTLESS LEGS SYNDROME   ??? hydroxychloroquine (PLAQUENIL) 200 mg tablet Take 1 Tab by mouth two (2) times a day.     No current facility-administered medications for this visit.       Problem List:     Patient Active Problem List    Diagnosis Date Noted   ??? Diabetes mellitus type 2, controlled (HCC) 07/22/2014   ??? Essential hypertension 07/22/2014       Medical History:     Past Medical History   Diagnosis Date   ??? Hypertension    ??? Diabetes (HCC)    ??? Restless leg syndrome    ??? Anxiety    ??? Lupus (HCC)        Allergies:     Allergies   Allergen Reactions    ??? Reglan [Metoclopramide] Other (comments)     Spontaneous arm movements   ??? Diclofenac Other (comments)     Elevated liver enzymes.       Surgical History:     Past Surgical History   Procedure Laterality Date   ??? Hx cesarean section  1989/1991   ??? Hx dilation and curettage  2013       Social History:     History     Social History   ??? Marital Status: MARRIED     Spouse Name: N/A     Number of Children: N/A   ??? Years of Education: N/A     Social History Main Topics   ??? Smoking status: Never Smoker    ??? Smokeless tobacco: Never Used   ??? Alcohol Use: 0.0 oz/week     0 Glasses of wine per week   ??? Drug Use: No   ??? Sexual Activity: Not on file     Other Topics Concern   ??? Not on file     Social History Narrative  Review of Symptoms:  Constitutional: c/o malaise, denies fever or chills  Head: negative for facial swelling or tenderness  Eyes: negative for redness or discharge  Ears: c/o otalgia   Nose: c/o nasal congestion, denies sinus pressure  Neck: c/o sore throat, edema  Cardiovascular: negative for chest pain or palpitations  Respiratory: c/o non-productive cough, denies wheezing or SOB  Gastrointestinal: negative for nausea or abdominal pain  Neurologic: negative for headache or dizziness  Skin: negative for rash or lesion    Visit Vitals   Item Reading   ??? BP 119/55 mmHg   ??? Pulse 80   ??? Temp(Src) 98 ??F (36.7 ??C) (Oral)   ??? Resp 18   ??? Ht 5\' 4"  (1.626 m)   ??? Wt 210 lb 12.8 oz (95.618 kg)   ??? BMI 36.17 kg/m2   ??? SpO2 96%       Physical Examination:  General: Well developed, well nourished, in no acute distress  Head: Normocephalic, atraumatic  Eyes: Sclera clear, EOMI, PERRL  Ears: tympanic membranes normal in appearance  Nose: mucosal edema with rhinorrhea  Oropharynx: posterior erythema, no exudate   Neck: Normal range of motion, no lymphadenopathy  Cardiovascular: normal S1, S2, regular rate and rhythm  Respiratory: Clear to auscultation bilaterally with symmetrical, unlabored effort   Abdomen: Soft, Normal BS  Extremities: Full range of motion  Neurologic: Active, alert and oriented  Skin: Warm and dry sans rash or lesion    Angela Elliott was seen today for ear pain, neck pain and diarrhea.    Diagnoses and all orders for this visit:    Otalgia of left ear  Orders:  -     clindamycin (CLEOCIN) 150 mg capsule; Take 1 Cap by mouth three (3) times daily for 10 days.    Upper respiratory infection of multiple sites  Orders:  -     cetirizine (ZYRTEC) 10 mg tablet; Take 1 Tab by mouth nightly.    Diabetes mellitus type 2, controlled (HCC)        Symptomatic therapy suggested: rest, increase fluids and call prn if symptoms persist or worsen.  Reminded patient of need to return for fasting lab work.  I have discussed the diagnosis with the patient and the intended plan as seen in the above orders.The patient expresses understanding and agreement with our plan of care. All of the patient's questions were answered to apparent satisfaction. The patient has received an after-visit summary. The patient knows to call our office if there are any questions or concerns regarding diagnosis and treatment plans. I have discussed medication side effects and warnings with the patient as well.      Follow-up Disposition:  Return if symptoms worsen or fail to improve.

## 2014-08-15 MED ORDER — FLUTICASONE 50 MCG/ACTUATION NASAL SPRAY, SUSP
50 mcg/actuation | Freq: Every day | NASAL | Status: DC
Start: 2014-08-15 — End: 2017-05-29

## 2014-08-15 NOTE — Telephone Encounter (Signed)
Nasonex not covered. Can we try generic alternative Flonase? Please advise

## 2014-08-15 NOTE — Telephone Encounter (Signed)
Prescription e-scribed to pharmacy.

## 2014-08-17 NOTE — Addendum Note (Signed)
Addended by: Norval MortonJONES, Margarito Dehaas M on: 08/17/2014 09:35 AM      Modules accepted: Orders

## 2014-08-18 LAB — METABOLIC PANEL, COMPREHENSIVE
A-G Ratio: 2 (ref 1.1–2.5)
ALT (SGPT): 32 IU/L (ref 0–32)
AST (SGOT): 20 IU/L (ref 0–40)
Albumin: 4.5 g/dL (ref 3.5–5.5)
Alk. phosphatase: 53 IU/L (ref 39–117)
BUN/Creatinine ratio: 27 — ABNORMAL HIGH (ref 9–23)
BUN: 17 mg/dL (ref 6–24)
Bilirubin, total: 0.2 mg/dL (ref 0.0–1.2)
CO2: 26 mmol/L (ref 18–29)
Calcium: 9.4 mg/dL (ref 8.7–10.2)
Chloride: 99 mmol/L (ref 97–108)
Creatinine: 0.64 mg/dL (ref 0.57–1.00)
GFR est AA: 115 mL/min/{1.73_m2} (ref >59)
GFR est non-AA: 100 mL/min/{1.73_m2} (ref >59)
GLOBULIN, TOTAL: 2.2 g/dL (ref 1.5–4.5)
Glucose: 94 mg/dL (ref 65–99)
Potassium: 4.4 mmol/L (ref 3.5–5.2)
Protein, total: 6.7 g/dL (ref 6.0–8.5)
Sodium: 140 mmol/L (ref 134–144)

## 2014-08-18 LAB — LIPID PANEL
Cholesterol, total: 238 mg/dL — ABNORMAL HIGH (ref 100–199)
HDL Cholesterol: 69 mg/dL (ref >39)
LDL, calculated: 136 mg/dL — ABNORMAL HIGH (ref 0–99)
Triglyceride: 164 mg/dL — ABNORMAL HIGH (ref 0–149)
VLDL, calculated: 33 mg/dL (ref 5–40)

## 2014-08-18 LAB — CBC W/O DIFF
HCT: 39.2 % (ref 34.0–46.6)
HGB: 12.8 g/dL (ref 11.1–15.9)
MCH: 30.7 pg (ref 26.6–33.0)
MCHC: 32.7 g/dL (ref 31.5–35.7)
MCV: 94 fL (ref 79–97)
PLATELET: 287 10*3/uL (ref 150–379)
RBC: 4.17 x10E6/uL (ref 3.77–5.28)
RDW: 13.7 % (ref 12.3–15.4)
WBC: 4.9 10*3/uL (ref 3.4–10.8)

## 2014-08-18 LAB — TSH 3RD GENERATION: TSH: 1.68 u[IU]/mL (ref 0.450–4.500)

## 2014-08-18 LAB — HEMOGLOBIN A1C WITH EAG: Hemoglobin A1c: 6 % — ABNORMAL HIGH (ref 4.8–5.6)

## 2014-08-26 ENCOUNTER — Ambulatory Visit: Admit: 2014-08-26 | Discharge: 2014-08-26 | Payer: PRIVATE HEALTH INSURANCE | Attending: Family Medicine

## 2014-08-26 DIAGNOSIS — J300 Vasomotor rhinitis: Secondary | ICD-10-CM

## 2014-08-26 NOTE — Progress Notes (Signed)
Chief Complaint   Patient presents with   ??? Follow-up     here for follow up on ears and go over labs   ??? Immunization/Injection     flu shot     Angela Elliott is a 58 y.o. female presents for follow up of congestion and otalgia. Symptoms improved with daily anti-histamine. Uses nasal steroid prn. Severity of symptoms related to weather changes. Patient with hx of DM2 and HTN. With recent lab work.    Diabetes:   This patient is being treating under a comprehensive plan of care for diabetes.  Overall the patient feels well with good energy level.   Insulin dependence: no   Pertinent Labs:   Lab Results   Component Value Date/Time    HEMOGLOBIN A1C 6.0 08/17/2014 09:25 AM      Body mass index is 36.03 kg/(m^2).     Lab Results   Component Value Date/Time    LDL, CALCULATED 136 08/17/2014 09:25 AM        Wt Readings from Last 3 Encounters:   08/26/14 210 lb (95.255 kg)   08/02/14 210 lb 12.8 oz (95.618 kg)   07/27/14 213 lb (96.616 kg)        History   Smoking status   ??? Never Smoker    Smokeless tobacco   ??? Never Used        Medications, diet and exercise as means of diabetic control with a goal of an A1C of less than 7.0% discussed. Diabetic foot care and annual eye exam discussed as well.    Check blood sugars while fasting just before breakfast on most days and occasionally before dinner.  Write down readings in a diabetic log book and bring them to the next visit.    Call the office for fasting sugars over 200 or below 75 on two or more occasions.  Call immediately if having symptoms of high sugar (frequent urination, always thirsty) or low sugar (dizzy, lethargic, sweaty, nauseated, headache).    Our overall goal is to reduce or eliminate the long term consequences of poorly controlled diabetes. Patient expresses understanding and agreement with our plan of care.    Hypertension:  The patient reports:  taking medications as instructed, no medication side  effects noted, no TIA's, no chest pain on exertion, no dyspnea on exertion, no swelling of ankles.      BP Readings from Last 3 Encounters:   08/26/14 126/67   08/02/14 119/55   07/27/14 100/57     Lab Results   Component Value Date/Time    SODIUM 140 08/17/2014 09:25 AM    POTASSIUM 4.4 08/17/2014 09:25 AM    CHLORIDE 99 08/17/2014 09:25 AM    CO2 26 08/17/2014 09:25 AM    GLUCOSE 94 08/17/2014 09:25 AM    BUN 17 08/17/2014 09:25 AM    CREATININE 0.64 08/17/2014 09:25 AM    BUN/CREATININE RATIO 27 08/17/2014 09:25 AM    GFR EST AA 115 08/17/2014 09:25 AM    GFR EST NON-AA 100 08/17/2014 09:25 AM    CALCIUM 9.4 08/17/2014 09:25 AM     Patient advised to log blood pressures at home 2-3 times weekly and bring to next visit.  Call office as soon as possible if BP's over 140/90 on multiple occasions or with symptoms of dizziness, chest pain, shortness of breath, headache or ankle swelling.  Our goal is to normalize the blood pressure to decrease the risks of strokes and heart attacks. The  patient is in agreement with the plan.        Medications:     Current Outpatient Prescriptions   Medication Sig   ??? fluticasone (FLONASE) 50 mcg/actuation nasal spray 2 Sprays by Both Nostrils route daily.   ??? cetirizine (ZYRTEC) 10 mg tablet Take 1 Tab by mouth nightly.   ??? metFORMIN ER (GLUCOPHAGE XR) 500 mg tablet Take two tablets twice daily   ??? hydrochlorothiazide (HYDRODIURIL) 25 mg tablet Take 1 Tab by mouth daily.   ??? lisinopril (PRINIVIL, ZESTRIL) 40 mg tablet Take 1 Tab by mouth daily.   ??? citalopram (CELEXA) 20 mg tablet Take 3 Tabs by mouth daily. Take 3 tablets by mouth once daily   ??? pramipexole (MIRAPEX) 0.25 mg tablet Take 1 Tab by mouth two (2) times a day. Indications: RESTLESS LEGS SYNDROME   ??? hydroxychloroquine (PLAQUENIL) 200 mg tablet Take 1 Tab by mouth two (2) times a day.     No current facility-administered medications for this visit.       Problem List:     Patient Active Problem List     Diagnosis Date Noted   ??? Diabetes mellitus type 2, controlled (HCC) 07/22/2014   ??? Essential hypertension 07/22/2014       Medical History:     Past Medical History   Diagnosis Date   ??? Hypertension    ??? Diabetes (HCC)    ??? Restless leg syndrome    ??? Anxiety    ??? Lupus (HCC)        Allergies:     Allergies   Allergen Reactions   ??? Reglan [Metoclopramide] Other (comments)     Spontaneous arm movements   ??? Diclofenac Other (comments)     Elevated liver enzymes.       Surgical History:     Past Surgical History   Procedure Laterality Date   ??? Hx cesarean section  1989/1991   ??? Hx dilation and curettage  2013       Social History:     History     Social History   ??? Marital Status: MARRIED     Spouse Name: N/A     Number of Children: N/A   ??? Years of Education: N/A     Social History Main Topics   ??? Smoking status: Never Smoker    ??? Smokeless tobacco: Never Used   ??? Alcohol Use: 0.0 oz/week     0 Glasses of wine per week   ??? Drug Use: No   ??? Sexual Activity: Not on file     Other Topics Concern   ??? Not on file     Social History Narrative       Review of Symptoms:  Constitutional: negative for fever or chills  Head: negative for facial swelling or tenderness  Eyes: negative for redness or discharge  Ears: c/o intermittent otalgia   Nose: c/o intermittent nasal congestion, denies sinus pressure  Neck: negative for  sore throat, edema  Cardiovascular: negative for chest pain or palpitations  Respiratory: negative for cough, wheezing or SOB  Gastrointestinal: negative for nausea or abdominal pain  Neurologic: negative for headache or dizziness  Skin: negative for rash or lesion    Visit Vitals   Item Reading   ??? BP 126/67 mmHg   ??? Pulse 84   ??? Temp(Src) 98 ??F (36.7 ??C) (Oral)   ??? Resp 20   ??? Ht 5\' 4"  (1.626 m)   ??? Wt 210 lb (  95.255 kg)   ??? BMI 36.03 kg/m2   ??? SpO2 98%       Physical Examination:  General: Well developed, well nourished, in no acute distress  Head: Normocephalic, atraumatic  Eyes: Sclera clear, EOMI   Neck: Normal range of motion  Cardiovascular: regular rate and rhythm  Respiratory: Clear with symmetrical, unlabored effort  Extremities: Full range of motion  Neurologic: Active, alert and oriented  Skin: Warm and dry sans rash or lesion    Angela Elliott was seen today for follow-up and immunization/injection.    Diagnoses and all orders for this visit:    Vasomotor rhinitis    Diabetes mellitus type 2, controlled (HCC)    Essential hypertension    Encounter for immunization  Orders:  -     INFLUENZA VIRUS VAC QUAD,SPLIT,PRESV FREE SYRINGE 3/> YRS IM  -     PR IMMUNIZ ADMIN,1 SINGLE/COMB VAC/TOXOID    reviewed lab results with patient.  I have discussed the diagnosis with the patient and the intended plan as seen in the above orders.The patient expresses understanding and agreement with our plan of care. All of the patient's questions were answered to apparent satisfaction. The patient has received an after-visit summary. The patient knows to call our office if there are any questions or concerns regarding diagnosis and treatment plans. I have discussed medication side effects and warnings with the patient as well.  Over half of the 25 minutes face to face with Angela Elliott consisted of counseling and discussing treatment plans in reference to her vasomotor rhinitis and lab results..    Follow-up Disposition:  Return in about 4 months (around 12/26/2014), or if symptoms worsen or fail to improve.

## 2014-08-26 NOTE — Patient Instructions (Signed)
A Healthy Lifestyle: After Your Visit  Your Care Instructions  A healthy lifestyle can help you feel good, stay at a healthy weight, and have plenty of energy for both work and play. A healthy lifestyle is something you can share with your whole family.  A healthy lifestyle also can lower your risk for serious health problems, such as high blood pressure, heart disease, and diabetes.  You can follow a few steps listed below to improve your health and the health of your family.  Follow-up care is a key part of your treatment and safety. Be sure to make and go to all appointments, and call your doctor if you are having problems. It???s also a good idea to know your test results and keep a list of the medicines you take.  How can you care for yourself at home?  ?? Do not eat too much sugar, fat, or fast foods. You can still have dessert and treats now and then. The goal is moderation.  ?? Start small to improve your eating habits. Pay attention to portion sizes, drink less juice and soda pop, and eat more fruits and vegetables.  ?? Eat a healthy amount of food. A 3-ounce serving of meat, for example, is about the size of a deck of cards. Fill the rest of your plate with vegetables and whole grains.  ?? Limit the amount of soda and sports drinks you have every day. Drink more water when you are thirsty.  ?? Eat at least 5 servings of fruits and vegetables every day. It may seem like a lot, but it is not hard to reach this goal. A serving or helping is 1 piece of fruit, 1 cup of vegetables, or 2 cups of leafy, raw vegetables. Have an apple or some carrot sticks as an afternoon snack instead of a candy bar. Try to have fruits and/or vegetables at every meal.  ?? Make exercise part of your daily routine. You may want to start with simple activities, such as walking, bicycling, or slow swimming. Try to be active 30 to 60 minutes every day. You do not need to do all 30 to 60  minutes all at once. For example, you can exercise 3 times a day for 10 or 20 minutes. Moderate exercise is safe for most people, but it is always a good idea to talk to your doctor before starting an exercise program.  ?? Keep moving. Mow the lawn, work in the garden, or clean your house. Take the stairs instead of the elevator at work.  ?? If you smoke, quit. People who smoke have an increased risk for heart attack, stroke, cancer, and other lung illnesses. Quitting is hard, but there are ways to boost your chance of quitting tobacco for good.  ?? Use nicotine gum, patches, or lozenges.  ?? Ask your doctor about stop-smoking programs and medicines.  ?? Keep trying.  In addition to reducing your risk of diseases in the future, you will notice some benefits soon after you stop using tobacco. If you have shortness of breath or asthma symptoms, they will likely get better within a few weeks after you quit.  ?? Limit how much alcohol you drink. Moderate amounts of alcohol (up to 2 drinks a day for men, 1 drink a day for women) are okay. But drinking too much can lead to liver problems, high blood pressure, and other health problems.  Family health  If you have a family, there are many things you can   do together to improve your health.  ?? Eat meals together as a family as often as possible.  ?? Eat healthy foods. This includes fruits, vegetables, lean meats and dairy, and whole grains.  ?? Include your family in your fitness plan. Most people think of activities such as jogging or tennis as the way to fitness, but there are many ways you and your family can be more active. Anything that makes you breathe hard and gets your heart pumping is exercise. Here are some tips:  ?? Walk to do errands or to take your child to school or the bus.  ?? Go for a family bike ride after dinner instead of watching TV.   Where can you learn more?   Go to http://www.healthwise.net/[facility]   Enter U807 in the search box to learn more about "A Healthy Lifestyle: After Your Visit."   ?? 2006-2015 Healthwise, Inc[employer]. Care instructions adapted under license by [facility] (which disclaims liability or warranty for this information). This care instruction is for use with your licensed healthcare professional. If you have questions about a medical condition or this instruction, always ask your healthcare professional. Healthwise, Incorporated disclaims any warranty or liability for your use of this information.  Content Version: 10.5.422740; Current as of: July 30, 2013

## 2014-08-26 NOTE — Progress Notes (Signed)
Reviewed record in preparation for visit and have obtained necessary documentation.  Body mass index is 36.03 kg/(m^2).  Does the patient have a fuctioning glucose monitor?yes    Does the patient keep record of diabetes readings? no     LDL:136    A1C:6.0    BP:126/67    Gave patient diabetic recording logs. no    Does the patient take a daily blood thinning medication (aspirin, coumadin, etc)?no  Healthy lifestyle printed with AVS

## 2014-08-29 NOTE — Addendum Note (Signed)
Addended by: Belinda BlockHARDY, FREDA on: 08/29/2014 10:34 AM      Modules accepted: Level of Service

## 2014-10-20 MED ORDER — CITALOPRAM 40 MG TAB
40 mg | ORAL_TABLET | ORAL | Status: DC
Start: 2014-10-20 — End: 2015-03-13

## 2014-12-03 MED ORDER — METFORMIN SR 500 MG 24 HR TABLET
500 mg | ORAL_TABLET | ORAL | Status: DC
Start: 2014-12-03 — End: 2015-02-23

## 2014-12-28 ENCOUNTER — Encounter: Attending: Family Medicine

## 2015-01-01 MED ORDER — HYDROXYCHLOROQUINE 200 MG TAB
200 mg | ORAL_TABLET | ORAL | Status: DC
Start: 2015-01-01 — End: 2015-10-07

## 2015-01-06 ENCOUNTER — Ambulatory Visit: Admit: 2015-01-06 | Discharge: 2015-01-06 | Payer: PRIVATE HEALTH INSURANCE | Attending: Family Medicine

## 2015-01-06 DIAGNOSIS — E119 Type 2 diabetes mellitus without complications: Secondary | ICD-10-CM

## 2015-01-06 NOTE — Progress Notes (Signed)
Health Maintenance Due   Topic Date Due   ??? Hepatitis C Screening  1955-10-22   ??? FOOT EXAM Q1  11/17/1967   ??? MICROALBUMIN Q1  11/17/1967   ??? Tdap Age > 18  11/16/1976   ??? Td Q 10 Yrs Age > 18  11/16/1976   ??? PAP AKA CERVICAL CYTOLOGY  11/17/1978   ??? BREAST CANCER SCRN MAMMOGRAM  11/17/2007   ??? FOBT Q 1 YEAR AGE 57-75  11/17/2007       Diabetic Bundle:  LDL-136  A1C-6.0  BP-  Smoking?no  Anticoagulation medication? yes  Eye exam dilated?due  Foot exam?due

## 2015-01-11 LAB — ANA, RFLX TO 5-BIOMARKER PROFILE(ENA)
ANA, DIRECT: NEGATIVE
Antinuclear Antibodies Direct: NEGATIVE

## 2015-01-11 LAB — CBC WITH AUTOMATED DIFF
ABS. BASOPHILS: 0 10*3/uL (ref 0.0–0.2)
ABS. EOSINOPHILS: 0.2 10*3/uL (ref 0.0–0.4)
ABS. IMM. GRANS.: 0 10*3/uL (ref 0.0–0.1)
ABS. MONOCYTES: 0.3 10*3/uL (ref 0.1–0.9)
ABS. NEUTROPHILS: 2.2 10*3/uL (ref 1.4–7.0)
Abs Lymphocytes: 1.7 10*3/uL (ref 0.7–3.1)
BASOPHILS: 1 %
EOSINOPHILS: 5 %
HCT: 39.2 % (ref 34.0–46.6)
HGB: 12.5 g/dL (ref 11.1–15.9)
IMMATURE GRANULOCYTES: 0 %
Lymphocytes: 38 %
MCH: 30.3 pg (ref 26.6–33.0)
MCHC: 31.9 g/dL (ref 31.5–35.7)
MCV: 95 fL (ref 79–97)
MONOCYTES: 7 %
NEUTROPHILS: 49 %
PLATELET: 254 10*3/uL (ref 150–379)
RBC: 4.13 x10E6/uL (ref 3.77–5.28)
RDW: 13.7 % (ref 12.3–15.4)
WBC: 4.4 10*3/uL (ref 3.4–10.8)

## 2015-01-11 LAB — METABOLIC PANEL, COMPREHENSIVE
A-G Ratio: 2.1 (ref 1.1–2.5)
ALT (SGPT): 19 IU/L (ref 0–32)
AST (SGOT): 15 IU/L (ref 0–40)
Albumin: 4.5 g/dL (ref 3.5–5.5)
Alk. phosphatase: 50 IU/L (ref 39–117)
BUN/Creatinine ratio: 29 — ABNORMAL HIGH (ref 9–23)
BUN: 17 mg/dL (ref 6–24)
Bilirubin, total: 0.2 mg/dL (ref 0.0–1.2)
CO2: 24 mmol/L (ref 18–29)
Calcium: 9.3 mg/dL (ref 8.7–10.2)
Chloride: 99 mmol/L (ref 97–108)
Creatinine: 0.58 mg/dL (ref 0.57–1.00)
GFR est AA: 118 mL/min/{1.73_m2} (ref >59)
GFR est non-AA: 103 mL/min/{1.73_m2} (ref >59)
GLOBULIN, TOTAL: 2.1 g/dL (ref 1.5–4.5)
Glucose: 100 mg/dL — ABNORMAL HIGH (ref 65–99)
Potassium: 4.6 mmol/L (ref 3.5–5.2)
Protein, total: 6.6 g/dL (ref 6.0–8.5)
Sodium: 141 mmol/L (ref 134–144)

## 2015-01-11 LAB — LIPID PANEL
Cholesterol, total: 208 mg/dL — ABNORMAL HIGH (ref 100–199)
HDL Cholesterol: 67 mg/dL (ref >39)
LDL, calculated: 96 mg/dL (ref 0–99)
Triglyceride: 223 mg/dL — ABNORMAL HIGH (ref 0–149)
VLDL, calculated: 45 mg/dL — ABNORMAL HIGH (ref 5–40)

## 2015-01-11 LAB — HEMOGLOBIN A1C WITH EAG
Estimated average glucose: 126 mg/dL
Hemoglobin A1c: 6 % — ABNORMAL HIGH (ref 4.8–5.6)

## 2015-01-11 LAB — TSH 3RD GENERATION: TSH: 2.07 u[IU]/mL (ref 0.450–4.500)

## 2015-01-13 ENCOUNTER — Ambulatory Visit: Admit: 2015-01-13 | Discharge: 2015-01-13 | Payer: PRIVATE HEALTH INSURANCE | Attending: Family Medicine

## 2015-01-13 DIAGNOSIS — B029 Zoster without complications: Secondary | ICD-10-CM

## 2015-01-13 MED ORDER — FAMCICLOVIR 500 MG TAB
500 mg | ORAL_TABLET | Freq: Three times a day (TID) | ORAL | Status: AC
Start: 2015-01-13 — End: 2015-01-20

## 2015-01-13 MED ORDER — ACETAMINOPHEN-CODEINE 300 MG-30 MG TAB
300-30 mg | ORAL_TABLET | ORAL | Status: DC | PRN
Start: 2015-01-13 — End: 2015-04-14

## 2015-01-13 NOTE — Patient Instructions (Signed)
Shingles: Care Instructions  Your Care Instructions     Shingles (herpes zoster) causes pain and a blistered rash. The rash can appear anywhere on the body but will be on only one side of the body, the left or right. It will be in a band, a strip, or a small area. The pain can be very severe. Shingles can also cause tingling or itching in the area of the rash. The blisters scab over after a few days and heal in 2 to 4 weeks. Medicines can help you feel better and may help prevent more serious problems caused by shingles.  Shingles is caused by the same virus that causes chickenpox. When you have chickenpox, the virus gets into your nerve roots and stays there (becomes dormant) long after you get over the chickenpox. If the virus becomes active again, it can cause shingles.  Follow-up care is a key part of your treatment and safety. Be sure to make and go to all appointments, and call your doctor if you are having problems. It's also a good idea to know your test results and keep a list of the medicines you take.  How can you care for yourself at home?  ?? Be safe with medicines. Take your medicines exactly as prescribed. Call your doctor if you think you are having a problem with your medicine. Antiviral medicine helps you get better faster.  ?? Try not to scratch or pick at the blisters. They will crust over and fall off on their own if you leave them alone.  ?? Put cool, wet cloths on the area to relieve pain and itching. You can also use calamine lotion. Try not to use so much lotion that it cakes and is hard to get off.  ?? Put cornstarch or baking soda on the sores to help dry them out so they heal faster.  ?? Do not use thick ointment, such as petroleum jelly, on the sores. This will keep them from drying and healing.  ?? To help remove loose crusts, soak them in tap water. This can help decrease oozing, and dry and soothe the skin.  ?? Take an over-the-counter pain medicine, such as acetaminophen (Tylenol),  ibuprofen (Advil, Motrin), or naproxen (Aleve). Read and follow all instructions on the label.  ?? Avoid close contact with people until the blisters have healed. It is very important for you to avoid contact with anyone who has never had chickenpox or the chickenpox vaccine. Pregnant women, young babies, and anyone else who has a hard time fighting infection (such as someone with HIV, diabetes, or cancer) is especially at risk.  When should you call for help?  Call your doctor now or seek immediate medical care if:  ?? You have a new or higher fever.  ?? You have a severe headache and a stiff neck.  ?? You lose the ability to think clearly.  ?? The rash spreads to your forehead, nose, eyes, or eyelids.  ?? You have eye pain, or your vision gets worse.  ?? You have new pain in your face, or you cannot move the muscles in your face.  ?? Blisters spread to new parts of your body.  Watch closely for changes in your health, and be sure to contact your doctor if:  ?? The rash has not healed after 2 to 4 weeks.  ?? You still have pain after the rash has healed.   Where can you learn more?   Go to http://www.healthwise.net/[facility]  Enter   X176 in the search box to learn more about "Shingles: Care Instructions."   ?? 2006-2016 Healthwise, Incorporated. Care instructions adapted under license by [facility] (which disclaims liability or warranty for this information). This care instruction is for use with your licensed healthcare professional. If you have questions about a medical condition or this instruction, always ask your healthcare professional. Healthwise, Incorporated disclaims any warranty or liability for your use of this information.  Content Version: 10.8.513193; Current as of: Feb 04, 2014

## 2015-01-13 NOTE — Progress Notes (Signed)
Health Maintenance Due   Topic Date Due   ??? Hepatitis C Screening  Jun 07, 1956   ??? FOOT EXAM Q1  11/17/1967   ??? MICROALBUMIN Q1  11/17/1967   ??? Tdap Age > 18  11/16/1976   ??? Td Q 10 Yrs Age > 18  11/16/1976   ??? PAP AKA CERVICAL CYTOLOGY  11/17/1978   ??? BREAST CANCER SCRN MAMMOGRAM  11/17/2007   ??? FOBT Q 1 YEAR AGE 57-75  11/17/2007

## 2015-01-14 NOTE — Progress Notes (Signed)
Progress Note    Patient: Angela Elliott MRN: [id number]  SSN: [id number]    Date of Birth: 02/27/1956  Age: 59 y.o.  Sex: female        Subjective:     Chief Complaint   Patient presents with   ??? Rash     righ flank       HPI:    Wt Readings from Last 3 Encounters:   01/13/15 219 lb (99.338 kg)   01/06/15 218 lb 6.4 oz (99.066 kg)   08/26/14 210 lb (95.255 kg)     Body mass index is 37.57 kg/(m^2).    BP Readings from Last 3 Encounters:   01/13/15 131/64   01/06/15 125/79   08/26/14 126/67       Problems Addressed    Encounter Diagnoses   Name Primary?   ??? Herpes zoster without complication Yes       Current and past medical information:    Current Medications after this visit::     Current Outpatient Prescriptions   Medication Sig   ??? famciclovir (FAMVIR) 500 mg tablet Take 1 Tab by mouth three (3) times daily for 7 days.   ??? acetaminophen-codeine (TYLENOL #3) 300-30 mg per tablet Take 1 Tab by mouth every four (4) hours as needed for Pain. Max Daily Amount: 6 Tabs.   ??? loratadine (CLARITIN) 10 mg tablet Take 10 mg by mouth.   ??? mometasone (NASONEX) 50 mcg/actuation nasal spray 2 Sprays daily.   ??? hydroxychloroquine (PLAQUENIL) 200 mg tablet TAKE ONE TABLET BY MOUTH TWICE DAILY   ??? metFORMIN ER (GLUCOPHAGE XR) 500 mg tablet TAKE TWO TABLETS BY MOUTH TWICE DAILY   ??? citalopram (CELEXA) 40 mg tablet TAKE 1 & 1/2 TABLETS BY MOUTH EVERY DAY   ??? fluticasone (FLONASE) 50 mcg/actuation nasal spray 2 Sprays by Both Nostrils route daily.   ??? hydrochlorothiazide (HYDRODIURIL) 25 mg tablet Take 1 Tab by mouth daily.   ??? lisinopril (PRINIVIL, ZESTRIL) 40 mg tablet Take 1 Tab by mouth daily.   ??? pramipexole (MIRAPEX) 0.25 mg tablet Take 1 Tab by mouth two (2) times a day. Indications: RESTLESS LEGS SYNDROME     No current facility-administered medications for this visit.       Patient Active Problem List    Diagnosis Date Noted   ??? Diabetes mellitus type 2, controlled (HCC) 07/22/2014   ??? Essential hypertension 07/22/2014        Past Medical History   Diagnosis Date   ??? Hypertension    ??? Diabetes (HCC)    ??? Restless leg syndrome    ??? Anxiety    ??? Lupus (HCC)        Allergies   Allergen Reactions   ??? Reglan [Metoclopramide] Other (comments)     Spontaneous arm movements   ??? Diclofenac Other (comments)     Elevated liver enzymes.       Past Surgical History   Procedure Laterality Date   ??? Hx cesarean section  1989/1991   ??? Hx dilation and curettage  2013       History     Social History   ??? Marital Status: MARRIED     Spouse Name: N/A   ??? Number of Children: N/A   ??? Years of Education: N/A     Social History Main Topics   ??? Smoking status: Never Smoker    ??? Smokeless tobacco: Never Used   ??? Alcohol Use: 0.0 oz/week  0 Glasses of wine per week   ??? Drug Use: No   ??? Sexual Activity: Not on file     Other Topics Concern   ??? None     Social History Narrative         Objective:     Review of Systems:  Constitutional: Negative for fatigue or malaise  Endo: Negative for unusual thirst or weight changes  HEENT: Negative for acute hearing or vision changes  Cardiovascular: Negative for dizziness, chest pain or palpitations  Respiratory: Negative for cough, wheezing or SOB  Gastreintestinal: Negative for nausea or abdominal pain  Genital/urinary: Negative for dysuria or voiding dysfunction  Muscoloskeletal: Negative for myalgias or arthralgias   Neurological: Negative for headache, weakness or paresthesia  Skin: Negative for rash or lesion  Psychological: Negative for depression or anxiety      Filed Vitals:    01/13/15 1323   BP: 131/64   Pulse: 80   Temp: 98.1 ??F (36.7 ??C)   TempSrc: Oral   Resp: 20   Height:  (1.626 m)   Weight: 219 lb (99.338 kg)   SpO2: 99%      Body mass index is 37.57 kg/(m^2).    Physical Exam:  Constitutional: well developed, well nourished, in no acute distress  Head: normocephalic, atraumatic  Eyes: sclera clear, EOMI, PERRL  Oropharynx: moist mucous membranes sans lesion    Neck: normal range of motion, no lymphadenopathy  CV: normal S1, S2, regular rate and rhythm, no murmur  Respiratory: clear to auscultation bilaterally with symmetrical effort  Abdomen: soft, BS normal, non-tender, non-distended  Extremities: full range of motion, No edema  Neurology: DTRs equal, normal strength and sensation  Skin: warm and dry sans rash or lesion  Psych: active, alert and oriented, affect appropriate     Health Maintenance Due   Topic Date Due   ??? Hepatitis C Screening  September 04, 1956   ??? Tdap Age > 18  11/16/1976   ??? Td Q 10 Yrs Age > 18  11/16/1976   ??? PAP AKA CERVICAL CYTOLOGY  11/17/1978   ??? BREAST CANCER SCRN MAMMOGRAM  11/17/2007   ??? FOBT Q 1 YEAR AGE 7-75  11/17/2007       Risk, benefits and potential costs of recommended health maintenance discussed.  Patient expressed understanding and deferred at this time.      Assessment and orders:       ICD-10-CM ICD-9-CM    1. Herpes zoster without complication B02.9 053.9 famciclovir (FAMVIR) 500 mg tablet      acetaminophen-codeine (TYLENOL #3) 300-30 mg per tablet         Plan of care:  Discussed diagnoses in detail with patient.     Medication risks/benefits/side effects discussed with patient.     All of the patient's questions were addressed. The patient understands and agrees with our plan of care.    The patient knows to call back if they are unsure of or forget any changes we discussed today or if the symptoms change.     The patient received an After-Visit Summary which contains VS, orders, medication list and allergy list.     No care team member to display    Follow-up Disposition:  Return in about 4 weeks (around 02/10/2015), or if symptoms worsen or fail to improve.    No future appointments.    Signed By: Leola Brazil, MD     January 14, 2015

## 2015-01-14 NOTE — Progress Notes (Signed)
Progress Note    Patient: Angela Elliott MRN: [id number]  SSN: [id number]    Date of Birth: 21-Dec-1955  Age: 59 y.o.  Sex: female        Subjective:     Chief Complaint   Patient presents with   ??? Diabetes   ??? Labs       HPI:    Wt Readings from Last 3 Encounters:   01/13/15 219 lb (99.338 kg)   01/06/15 218 lb 6.4 oz (99.066 kg)   08/26/14 210 lb (95.255 kg)     Body mass index is 37.47 kg/(m^2).    BP Readings from Last 3 Encounters:   01/13/15 131/64   01/06/15 125/79   08/26/14 126/67       Problems Addressed    Encounter Diagnoses   Name Primary?   ??? Diabetes mellitus type 2, controlled (HCC) Yes   ??? Connective tissue disease (HCC)    ??? ANA positive    ??? Essential hypertension with goal blood pressure less than 130/80    ??? History of skin cancer    ??? Allergic rhinitis, unspecified allergic rhinitis type    ??? Abnormal menses        Current and past medical information:    Current Medications after this visit::     Current Outpatient Prescriptions   Medication Sig   ??? loratadine (CLARITIN) 10 mg tablet Take 10 mg by mouth.   ??? mometasone (NASONEX) 50 mcg/actuation nasal spray 2 Sprays daily.   ??? hydroxychloroquine (PLAQUENIL) 200 mg tablet TAKE ONE TABLET BY MOUTH TWICE DAILY   ??? metFORMIN ER (GLUCOPHAGE XR) 500 mg tablet TAKE TWO TABLETS BY MOUTH TWICE DAILY   ??? citalopram (CELEXA) 40 mg tablet TAKE 1 & 1/2 TABLETS BY MOUTH EVERY DAY   ??? hydrochlorothiazide (HYDRODIURIL) 25 mg tablet Take 1 Tab by mouth daily.   ??? lisinopril (PRINIVIL, ZESTRIL) 40 mg tablet Take 1 Tab by mouth daily.   ??? pramipexole (MIRAPEX) 0.25 mg tablet Take 1 Tab by mouth two (2) times a day. Indications: RESTLESS LEGS SYNDROME   ??? famciclovir (FAMVIR) 500 mg tablet Take 1 Tab by mouth three (3) times daily for 7 days.   ??? acetaminophen-codeine (TYLENOL #3) 300-30 mg per tablet Take 1 Tab by mouth every four (4) hours as needed for Pain. Max Daily Amount: 6 Tabs.   ??? fluticasone (FLONASE) 50 mcg/actuation nasal spray 2 Sprays by Both  Nostrils route daily.     No current facility-administered medications for this visit.       Patient Active Problem List    Diagnosis Date Noted   ??? Diabetes mellitus type 2, controlled (HCC) 07/22/2014   ??? Essential hypertension 07/22/2014       Past Medical History   Diagnosis Date   ??? Hypertension    ??? Diabetes (HCC)    ??? Restless leg syndrome    ??? Anxiety    ??? Lupus (HCC)        Allergies   Allergen Reactions   ??? Reglan [Metoclopramide] Other (comments)     Spontaneous arm movements   ??? Diclofenac Other (comments)     Elevated liver enzymes.       Past Surgical History   Procedure Laterality Date   ??? Hx cesarean section  1989/1991   ??? Hx dilation and curettage  2013       History     Social History   ??? Marital Status: MARRIED  Spouse Name: N/A   ??? Number of Children: N/A   ??? Years of Education: N/A     Social History Main Topics   ??? Smoking status: Never Smoker    ??? Smokeless tobacco: Never Used   ??? Alcohol Use: 0.0 oz/week     0 Glasses of wine per week   ??? Drug Use: No   ??? Sexual Activity: Not on file     Other Topics Concern   ??? None     Social History Narrative         Objective:     Review of Systems:  Constitutional: Negative for fatigue or malaise  Endo: Negative for unusual thirst or weight changes  HEENT: Negative for acute hearing or vision changes  Cardiovascular: Negative for dizziness, chest pain or palpitations  Respiratory: Negative for cough, wheezing or SOB  Gastreintestinal: Negative for nausea or abdominal pain  Genital/urinary: Negative for dysuria or voiding dysfunction  Muscoloskeletal: Negative for myalgias or arthralgias   Neurological: Negative for headache, weakness or paresthesia  Skin: Negative for rash or lesion  Psychological: Negative for depression or anxiety      Filed Vitals:    01/06/15 1431   BP: 125/79   Pulse: 90   Temp: 97.3 ??F (36.3 ??C)   TempSrc: Oral   Resp: 20   Height:  (1.626 m)   Weight: 218 lb 6.4 oz (99.066 kg)   SpO2: 98%       Body mass index is 37.47 kg/(m^2).    Physical Exam:  Constitutional: well developed, well nourished, in no acute distress  Head: normocephalic, atraumatic  Eyes: sclera clear, EOMI, PERRL  Oropharynx: moist mucous membranes sans lesion   Neck: normal range of motion, no lymphadenopathy  CV: normal S1, S2, regular rate and rhythm, no murmur  Respiratory: clear to auscultation bilaterally with symmetrical effort  Abdomen: soft, BS normal, non-tender, non-distended  Extremities: full range of motion, No edema  Neurology: DTRs equal, normal strength and sensation  Skin: warm and dry sans rash or lesion  Psych: active, alert and oriented, affect appropriate     Health Maintenance Due   Topic Date Due   ??? Hepatitis C Screening  [birth year]   ??? Tdap Age > 18  11/16/1976   ??? Td Q 10 Yrs Age > 18  11/16/1976   ??? PAP AKA CERVICAL CYTOLOGY  11/17/1978   ??? BREAST CANCER SCRN MAMMOGRAM  11/17/2007   ??? FOBT Q 1 YEAR AGE 69-75  11/17/2007       Risk, benefits and potential costs of recommended health maintenance discussed.  Patient expressed understanding and deferred at this time.      Assessment and orders:       ICD-10-CM ICD-9-CM    1. Diabetes mellitus type 2, controlled (HCC) E11.9 250.00 METABOLIC PANEL, COMPREHENSIVE      LIPID PANEL      HEMOGLOBIN A1C WITH EAG      CBC WITH AUTOMATED DIFF      METABOLIC PANEL, COMPREHENSIVE      LIPID PANEL      HEMOGLOBIN A1C WITH EAG      CBC WITH AUTOMATED DIFF   2. Connective tissue disease (HCC) M35.9 710.9 REFERRAL TO RHEUMATOLOGY      ANA W/REFLEX      ANA W/REFLEX   3. ANA positive R76.8 795.79 REFERRAL TO RHEUMATOLOGY      ANA W/REFLEX      ANA W/REFLEX   4. Essential hypertension  with goal blood pressure less than 130/80 I10 401.9 METABOLIC PANEL, COMPREHENSIVE      TSH 3RD GENERATION      METABOLIC PANEL, COMPREHENSIVE      TSH 3RD GENERATION   5. History of skin cancer Z85.828 V10.83 REFERRAL TO DERMATOLOGY    6. Allergic rhinitis, unspecified allergic rhinitis type J30.9 477.9 loratadine (CLARITIN) 10 mg tablet      mometasone (NASONEX) 50 mcg/actuation nasal spray   7. Abnormal menses N92.6 626.9 REFERRAL TO GYNECOLOGY         Plan of care:  Discussed diagnoses in detail with patient.     Medication risks/benefits/side effects discussed with patient.     All of the patient's questions were addressed. The patient understands and agrees with our plan of care.    The patient knows to call back if they are unsure of or forget any changes we discussed today or if the symptoms change.     The patient received an After-Visit Summary which contains VS, orders, medication list and allergy list.     No care team member to display    Follow-up Disposition:  Return in about 4 months (around 05/08/2015), or if symptoms worsen or fail to improve.    No future appointments.    Signed By: Leola Brazilennis D Artemisa Sladek, MD     January 14, 2015

## 2015-02-13 MED ORDER — PRAMIPEXOLE 0.25 MG TAB
0.25 mg | ORAL_TABLET | ORAL | Status: DC
Start: 2015-02-13 — End: 2015-08-16

## 2015-02-26 MED ORDER — METFORMIN SR 500 MG 24 HR TABLET
500 mg | ORAL_TABLET | ORAL | Status: DC
Start: 2015-02-26 — End: 2015-08-07

## 2015-03-16 MED ORDER — CITALOPRAM 40 MG TAB
40 mg | ORAL_TABLET | ORAL | Status: DC
Start: 2015-03-16 — End: 2015-09-06

## 2015-04-14 ENCOUNTER — Ambulatory Visit: Admit: 2015-04-14 | Discharge: 2015-04-14 | Payer: PRIVATE HEALTH INSURANCE | Attending: Family Medicine

## 2015-04-14 DIAGNOSIS — R220 Localized swelling, mass and lump, head: Secondary | ICD-10-CM

## 2015-04-14 MED ORDER — PREDNISONE 10 MG TAB
10 mg | ORAL_TABLET | Freq: Every day | ORAL | Status: DC
Start: 2015-04-14 — End: 2016-03-07

## 2015-04-14 MED ORDER — AMOXICILLIN CLAVULANATE 875 MG-125 MG TAB
875-125 mg | ORAL_TABLET | Freq: Two times a day (BID) | ORAL | Status: AC
Start: 2015-04-14 — End: 2015-04-24

## 2015-04-14 NOTE — Patient Instructions (Signed)
A Healthy Lifestyle: Care Instructions  Your Care Instructions  A healthy lifestyle can help you feel good, stay at a healthy weight, and have plenty of energy for both work and play. A healthy lifestyle is something you can share with your whole family.  A healthy lifestyle also can lower your risk for serious health problems, such as high blood pressure, heart disease, and diabetes.  You can follow a few steps listed below to improve your health and the health of your family.  Follow-up care is a key part of your treatment and safety. Be sure to make and go to all appointments, and call your doctor if you are having problems. It???s also a good idea to know your test results and keep a list of the medicines you take.  How can you care for yourself at home?  ?? Do not eat too much sugar, fat, or fast foods. You can still have dessert and treats now and then. The goal is moderation.  ?? Start small to improve your eating habits. Pay attention to portion sizes, drink less juice and soda pop, and eat more fruits and vegetables.  ?? Eat a healthy amount of food. A 3-ounce serving of meat, for example, is about the size of a deck of cards. Fill the rest of your plate with vegetables and whole grains.  ?? Limit the amount of soda and sports drinks you have every day. Drink more water when you are thirsty.  ?? Eat at least 5 servings of fruits and vegetables every day. It may seem like a lot, but it is not hard to reach this goal. A serving or helping is 1 piece of fruit, 1 cup of vegetables, or 2 cups of leafy, raw vegetables. Have an apple or some carrot sticks as an afternoon snack instead of a candy bar. Try to have fruits and/or vegetables at every meal.  ?? Make exercise part of your daily routine. You may want to start with simple activities, such as walking, bicycling, or slow swimming. Try to be active 30 to 60 minutes every day. You do not need to do all 30 to 60  minutes all at once. For example, you can exercise 3 times a day for 10 or 20 minutes. Moderate exercise is safe for most people, but it is always a good idea to talk to your doctor before starting an exercise program.  ?? Keep moving. Mow the lawn, work in the garden, or clean your house. Take the stairs instead of the elevator at work.  ?? If you smoke, quit. People who smoke have an increased risk for heart attack, stroke, cancer, and other lung illnesses. Quitting is hard, but there are ways to boost your chance of quitting tobacco for good.  ?? Use nicotine gum, patches, or lozenges.  ?? Ask your doctor about stop-smoking programs and medicines.  ?? Keep trying.  In addition to reducing your risk of diseases in the future, you will notice some benefits soon after you stop using tobacco. If you have shortness of breath or asthma symptoms, they will likely get better within a few weeks after you quit.  ?? Limit how much alcohol you drink. Moderate amounts of alcohol (up to 2 drinks a day for men, 1 drink a day for women) are okay. But drinking too much can lead to liver problems, high blood pressure, and other health problems.  Family health  If you have a family, there are many things you can do   together to improve your health.  ?? Eat meals together as a family as often as possible.  ?? Eat healthy foods. This includes fruits, vegetables, lean meats and dairy, and whole grains.  ?? Include your family in your fitness plan. Most people think of activities such as jogging or tennis as the way to fitness, but there are many ways you and your family can be more active. Anything that makes you breathe hard and gets your heart pumping is exercise. Here are some tips:  ?? Walk to do errands or to take your child to school or the bus.  ?? Go for a family bike ride after dinner instead of watching TV.  Where can you learn more?  Go to http://www.healthwise.net/[employer]   Enter U807 in the search box to learn more about "A Healthy Lifestyle: Care Instructions."  ?? 2006-2016 [employer]. Care instructions adapted under license by [employer] (which disclaims liability or warranty for this information). This care instruction is for use with your licensed healthcare professional. If you have questions about a medical condition or this instruction, always ask your healthcare professional. Healthwise, Incorporated disclaims any warranty or liability for your use of this information.  Content Version: 10.9.538570; Current as of: August 05, 2014

## 2015-04-14 NOTE — Progress Notes (Signed)
Reviewed record in preparation for visit and have obtained necessary documentation.  Patient did not bring medications to visit for review.  Information provided on Advanced Directive, Living Will.  Body mass index is 38.43 kg/(m^2).   Health Maintenance Due   Topic Date Due   ??? Hepatitis C Screening  [birth year]   ??? EYE EXAM RETINAL OR DILATED Q1  11/17/1967   ??? Pneumococcal 19-64 Medium Risk (1 of 1 - PPSV23) 11/16/1976   ??? DTaP/Tdap/Td series (1 - Tdap) 11/17/1978   ??? PAP AKA CERVICAL CYTOLOGY  11/17/1978   ??? BREAST CANCER SCRN MAMMOGRAM  11/17/2007   ??? FOBT Q 1 YEAR AGE 69-75  11/17/2007   - check for functional glucose monitor and record keeping system-yes  Pt was given BS record log to document home readings and return to office for review  Diabetic Bundle:  LDL-96  A1C-6.0  BP-139/67  Smoking?no  Anticoagulation medication?no   Eye exam dilated?  Foot exam?

## 2015-04-16 NOTE — Progress Notes (Signed)
Progress Note    Patient: Angela Elliott MRN: [id number]  SSN: [id number]    Date of Birth: 1956-08-02  Age: 59 y.o.  Sex: female        Chief Complaint   Patient presents with   ??? Facial Swelling     right side         Subjective:     Patient with DMT2, lupus, and Crohn's presents with 2 weeks of right ear pain and 1 day of right facial pain and swelling.  This is the third episode of these symptoms.  Patient had 1 occurrence in 2014 and 1 occurrence in 2015.  Each time there was ear pain associated with swelling.  Patient reports that she received meds, she thinks abx and prednisone, and in each case the medication resolved the issu    Her right facial pain is achy and is tender with any pressure, moderate in intensity,  including lying on the pillow.  She took some ibuprofen this AM and it seemed to reduce the swelling and pain.  There is associated swelling.  She notes that she waited to receive treatment last time for the facial pain and swelling for 2 weeks and it grew quite large and red.      She moved from [place] 1 year ago.  She has not seen Rheum here in [place] yet.  Feels like her aches are still in remission while on plaquenil.    Current and past medical information:    Current Medications after this visit::     Current Outpatient Prescriptions   Medication Sig   ??? predniSONE (DELTASONE) 10 mg tablet Take 1 Tab by mouth daily (with breakfast). Take 4 tabs for 3 days, 3 tabs for 3 days, 2 tabs for 3 days, take 1 tab for 5 days.   ??? amoxicillin-clavulanate (AUGMENTIN) 875-125 mg per tablet Take 1 Tab by mouth every twelve (12) hours for 10 days.   ??? citalopram (CELEXA) 40 mg tablet TAKE 1 & 1/2 TABLETS BY MOUTH EVERY DAY   ??? metFORMIN ER (GLUCOPHAGE XR) 500 mg tablet TAKE TWO TABLETS BY MOUTH TWICE DAILY   ??? pramipexole (MIRAPEX) 0.25 mg tablet TAKE ONE TABLET BY MOUTH TWICE DAILY   ??? loratadine (CLARITIN) 10 mg tablet Take 10 mg by mouth.    ??? mometasone (NASONEX) 50 mcg/actuation nasal spray 2 Sprays daily.   ??? hydroxychloroquine (PLAQUENIL) 200 mg tablet TAKE ONE TABLET BY MOUTH TWICE DAILY   ??? fluticasone (FLONASE) 50 mcg/actuation nasal spray 2 Sprays by Both Nostrils route daily.   ??? hydrochlorothiazide (HYDRODIURIL) 25 mg tablet Take 1 Tab by mouth daily.   ??? lisinopril (PRINIVIL, ZESTRIL) 40 mg tablet Take 1 Tab by mouth daily.     No current facility-administered medications for this visit.       Patient Active Problem List    Diagnosis Date Noted   ??? Diabetes mellitus type 2, controlled (HCC) 07/22/2014   ??? Essential hypertension 07/22/2014       Past Medical History   Diagnosis Date   ??? Hypertension    ??? Diabetes (HCC)    ??? Restless leg syndrome    ??? Anxiety    ??? Lupus (HCC)        Allergies   Allergen Reactions   ??? Reglan [Metoclopramide] Other (comments)     Spontaneous arm movements   ??? Diclofenac Other (comments)     Elevated liver enzymes.       Past Surgical History  Procedure Laterality Date   ??? Hx cesarean section  1989/1991   ??? Hx dilation and curettage  2013       History     Social History   ??? Marital Status: MARRIED     Spouse Name: N/A   ??? Number of Children: N/A   ??? Years of Education: N/A     Social History Main Topics   ??? Smoking status: Never Smoker    ??? Smokeless tobacco: Never Used   ??? Alcohol Use: 0.0 oz/week     0 Glasses of wine per week   ??? Drug Use: No   ??? Sexual Activity: Not on file     Other Topics Concern   ??? None     Social History Narrative       Review of Systems   Constitutional: Negative for fever and chills.   HENT: Positive for congestion. Negative for ear discharge.    Eyes: Negative for blurred vision and double vision.   Respiratory: Negative for cough and sputum production.    Neurological: Negative for headaches.         Objective:     Filed Vitals:    04/14/15 1308   BP: 139/67   Pulse: 101   Temp: 98.4 ??F (36.9 ??C)   TempSrc: Oral   Resp: 16   Height: 5\' 4"  (1.626 m)   Weight: 224 lb (101.606 kg)    SpO2: 96%      Body mass index is 38.43 kg/(m^2).      Physical Exam   Constitutional: She appears well-developed and well-nourished. No distress.   HENT:   Right Ear: External ear normal.   Left Ear: External ear normal.   Mouth/Throat: No oropharyngeal exudate.   Right TM shows some sclerosis, but no purulent discharge or erythema.  Canal without significant erythema.  Right preauricular area with swelling, no erythema, slightly tender to palpation.     Eyes: Pupils are equal, round, and reactive to light. Right eye exhibits no discharge. Left eye exhibits no discharge. No scleral icterus.   Neck: Normal range of motion. No thyromegaly present.   Cardiovascular: Normal rate and regular rhythm.  Exam reveals no gallop and no friction rub.    No murmur heard.  Pulmonary/Chest: Effort normal. No respiratory distress. She has no wheezes. She has no rales.   Abdominal: Soft. Bowel sounds are normal. She exhibits no distension. There is no tenderness.   Musculoskeletal: She exhibits no edema.   Lymphadenopathy:     She has no cervical adenopathy.   Neurological: She is alert. No cranial nerve deficit.   Skin: Skin is warm and dry. She is not diaphoretic.   Psychiatric: She has a normal mood and affect. Her behavior is normal.   Nursing note and vitals reviewed.      Health Maintenance Due   Topic Date Due   ??? Hepatitis C Screening  26-Mar-1956   ??? FOOT EXAM Q1  11/17/1967   ??? MICROALBUMIN Q1  11/17/1967   ??? EYE EXAM RETINAL OR DILATED Q1  11/17/1967   ??? Pneumococcal 19-64 Medium Risk (1 of 1 - PPSV23) 11/16/1976   ??? DTaP/Tdap/Td series (1 - Tdap) 11/17/1978   ??? PAP AKA CERVICAL CYTOLOGY  11/17/1978   ??? BREAST CANCER SCRN MAMMOGRAM  11/17/2007   ??? FOBT Q 1 YEAR AGE 39-75  11/17/2007       Assessment and orders:       ICD-10-CM ICD-9-CM    1.  Facial swelling R22.0 784.2 predniSONE (DELTASONE) 10 mg tablet      amoxicillin-clavulanate (AUGMENTIN) 875-125 mg per tablet      REFERRAL TO ENT-OTOLARYNGOLOGY    2. Lupus (HCC) M32.9 710.0 REFERRAL TO RHEUMATOLOGY     Patient has recurrent episodes of ear pain with facial swelling.  Has had difficulties with ear infections in the past.  She has DM and at risk for bacterial infections, particularly pseudomonas.  Unclear why she has recurrences.  Does not seems to be a primary problem of the parotid gland given the predating ear sx.  Would appreciate ENT input for possible underlying anatomical cause of symptoms recurrence.    Re: lupus, she had not established care with Rheum here in [place] and should see them in the near future.    Plan of care:  Discussed diagnoses in detail with patient.     Medication risks/benefits/side effects discussed with patient.     All of the patient's questions were addressed. The patient understands and agrees with our plan of care.    The patient knows to call back if they are unsure of or forget any changes we discussed today or if the symptoms change.     The patient received an After-Visit Summary which contains VS, orders, medication list and allergy list. This can be used as a "mini-medical record" should they have to seek medical care while out of town.    Patient Care Team:  Leola Brazil, MD as PCP - General (Family Practice)  Darryll Capers, MD (Rheumatology)  Earleen Reaper, MD (Otolaryngology)    Follow-up Disposition:  Return in about 2 days (around 04/16/2015), or if symptoms worsen or fail to improve.    No future appointments.      Seen w Dr. Sanda Klein    Signed By: Cathleen Fears, MD     April 14, 2015

## 2015-04-17 MED ORDER — HYDROCHLOROTHIAZIDE 25 MG TAB
25 mg | ORAL_TABLET | ORAL | Status: DC
Start: 2015-04-17 — End: 2015-07-19

## 2015-04-17 MED ORDER — LISINOPRIL 40 MG TAB
40 mg | ORAL_TABLET | ORAL | Status: DC
Start: 2015-04-17 — End: 2015-07-19

## 2015-04-17 NOTE — Telephone Encounter (Signed)
Medication(s) refilled as requested by patient's pharmacy

## 2015-04-19 NOTE — Progress Notes (Signed)
I saw and evaluated the patient, performing the key elements of the service.  I discussed the findings, assessment and plan with the resident and agree with the resident's findings and plan as documented in the resident's note.

## 2015-08-04 MED ORDER — LISINOPRIL 40 MG TAB
40 mg | ORAL_TABLET | ORAL | 2 refills | Status: DC
Start: 2015-08-04 — End: 2015-10-18

## 2015-08-04 MED ORDER — HYDROCHLOROTHIAZIDE 25 MG TAB
25 mg | ORAL_TABLET | ORAL | 2 refills | Status: DC
Start: 2015-08-04 — End: 2015-10-18

## 2015-08-04 NOTE — Telephone Encounter (Signed)
Is there anyway Dr. Ernestine ConradSwan can fill this one. It went to [facility] on July 19, 2015. She has been out for a while.

## 2015-08-09 MED ORDER — METFORMIN SR 500 MG 24 HR TABLET
500 mg | ORAL_TABLET | ORAL | 0 refills | Status: DC
Start: 2015-08-09 — End: 2015-10-07

## 2015-08-21 MED ORDER — PRAMIPEXOLE 0.25 MG TAB
0.25 mg | ORAL_TABLET | ORAL | 3 refills | Status: DC
Start: 2015-08-21 — End: 2015-11-13

## 2015-09-07 MED ORDER — CITALOPRAM 40 MG TAB
40 mg | ORAL_TABLET | ORAL | 5 refills | Status: DC
Start: 2015-09-07 — End: 2016-06-10

## 2015-09-19 ENCOUNTER — Encounter

## 2015-09-25 ENCOUNTER — Ambulatory Visit: Payer: BLUE CROSS/BLUE SHIELD

## 2015-09-25 ENCOUNTER — Encounter: Attending: Family Medicine

## 2015-09-27 ENCOUNTER — Ambulatory Visit: Admit: 2015-09-27 | Discharge: 2015-09-27 | Payer: PRIVATE HEALTH INSURANCE | Attending: Family Medicine

## 2015-09-27 ENCOUNTER — Encounter

## 2015-09-27 DIAGNOSIS — E119 Type 2 diabetes mellitus without complications: Secondary | ICD-10-CM

## 2015-09-27 LAB — AMB POC HEMOGLOBIN A1C: Hemoglobin A1c (POC): 6 %

## 2015-09-27 LAB — AMB POC URINALYSIS DIP STICK MANUAL W/O MICRO
Bilirubin (UA POC): NEGATIVE
Glucose (UA POC): NEGATIVE
Ketones (UA POC): NEGATIVE
Nitrites (UA POC): NEGATIVE
Protein (UA POC): NEGATIVE mg/dL
Specific gravity (UA POC): 1.03 (ref 1.001–1.035)
Urobilinogen (UA POC): 0.2 (ref 0.2–1)
pH (UA POC): 5.5 (ref 4.6–8.0)

## 2015-09-27 MED ORDER — PNEUMOCOCCAL 23-VALPS VACCINE 25 MCG/0.5 ML INJECTION
25 mcg/0.5 mL | Freq: Once | INTRAMUSCULAR | 0 refills | Status: AC
Start: 2015-09-27 — End: 2015-09-27

## 2015-09-27 NOTE — Progress Notes (Signed)
Patient: Angela SmartCathy K Sherrard MRN: [id number]  SSN: [id number]    Date of Birth: 06/08/1956  Age: 60 y.o.  Sex: female        Subjective:     Chief Complaint   Patient presents with   ??? Labs   ??? Diabetes   ??? Bladder Infection     odor       HPI: she is a 60 y.o. year old female who presents for follow up of chronic medical conditions. Patient with hx of DM2, HTN, autoimmune disease, restless leg syndrome and anxiety. Patient denies HA, dizziness, SOB, CP, abdominal pain, dysuria, myalgias or arthralgias. Patient does need medication refills. Patient is due for lab work.  Patient does have concern about strong urine odor.           Encounter Diagnoses   Name Primary?   ??? Controlled type 2 diabetes mellitus without complication, without long-term current use of insulin (HCC) Yes   ??? Essential hypertension    ??? Mixed hyperlipidemia    ??? Vitamin D deficiency    ??? Abnormal urine odor        BP Readings from Last 3 Encounters:   09/27/15 130/63   04/14/15 139/67   01/13/15 131/64       Wt Readings from Last 3 Encounters:   09/27/15 229 lb (103.9 kg)   04/14/15 224 lb (101.6 kg)   01/13/15 219 lb (99.3 kg)     Body mass index is 39.31 kg/(m^2).    Current and past medical information:    Current Medications after this visit::     Current Outpatient Prescriptions   Medication Sig   ??? citalopram (CELEXA) 40 mg tablet TAKE 1 & 1/2 TABLETS BY MOUTH EVERY DAY   ??? pramipexole (MIRAPEX) 0.25 mg tablet TAKE ONE TABLET BY MOUTH TWICE DAILY   ??? metFORMIN ER (GLUCOPHAGE XR) 500 mg tablet TAKE TWO TABLETS BY MOUTH TWICE DAILY   ??? lisinopril (PRINIVIL, ZESTRIL) 40 mg tablet TAKE ONE TABLET BY MOUTH EVERY DAY   ??? hydroCHLOROthiazide (HYDRODIURIL) 25 mg tablet TAKE ONE TABLET BY MOUTH EVERY DAY   ??? predniSONE (DELTASONE) 10 mg tablet Take 1 Tab by mouth daily (with breakfast). Take 4 tabs for 3 days, 3 tabs for 3 days, 2 tabs for 3 days, take 1 tab for 5 days.   ??? loratadine (CLARITIN) 10 mg tablet Take 10 mg by mouth.    ??? mometasone (NASONEX) 50 mcg/actuation nasal spray 2 Sprays daily.   ??? hydroxychloroquine (PLAQUENIL) 200 mg tablet TAKE ONE TABLET BY MOUTH TWICE DAILY   ??? fluticasone (FLONASE) 50 mcg/actuation nasal spray 2 Sprays by Both Nostrils route daily.     No current facility-administered medications for this visit.        Patient Active Problem List    Diagnosis Date Noted   ??? Diabetes mellitus type 2, controlled (HCC) 07/22/2014   ??? Essential hypertension 07/22/2014       Past Medical History   Diagnosis Date   ??? Anxiety    ??? Diabetes (HCC)    ??? Hypertension    ??? Lupus (HCC)    ??? Restless leg syndrome        Allergies   Allergen Reactions   ??? Reglan [Metoclopramide] Other (comments)     Spontaneous arm movements   ??? Diclofenac Other (comments)     Elevated liver enzymes.       Past Surgical History   Procedure Laterality Date   ??? Hx  cesarean section  1989/1991   ??? Hx dilation and curettage  2013       Social History     Social History   ??? Marital status: MARRIED     Spouse name: N/A   ??? Number of children: N/A   ??? Years of education: N/A     Social History Main Topics   ??? Smoking status: Never Smoker   ??? Smokeless tobacco: Never Used   ??? Alcohol use 0.0 oz/week     0 Glasses of wine per week   ??? Drug use: No   ??? Sexual activity: Not Asked     Other Topics Concern   ??? None     Social History Narrative         Objective:     Review of Systems:  Constitutional: Negative for fatigue or malaise  Derm: Negative for rash or lesion  HEENT: Negative for acute hearing or vision changes  Cardiovascular: Negative for dizziness, chest pain or palpitations  Respiratory: Negative for cough, wheezing or SOB  Gastreintestinal: Negative for nausea or abdominal pain  Genital/urinary: Negative for dysuria or voiding dysfunction  Muscoloskeletal: Negative for acute myalgias or arthralgias   Neurological: Negative for headache, weakness or paresthesia  Psychological: Negative for depression or anxiety      Vitals:    09/27/15 0951    BP: 130/63   Pulse: 94   Resp: 12   Temp: 98.1 ??F (36.7 ??C)   TempSrc: Oral   SpO2: 97%   Weight: 229 lb (103.9 kg)   Height: 5\' 4"  (1.626 m)      Body mass index is 39.31 kg/(m^2).    Physical Exam:  Constitutional: well developed, well nourished, in no acute distress  Skin: warm and dry, normal tone and turgor  Head: normocephalic, atraumatic  Eyes: sclera clear, EOMI, PERRL  Neck: normal range of motion  Cardiovascular: normal S1, S2, regular rate and rhythm  Respiratory: clear to auscultation bilaterally with symmetrical effort  Abdomen: soft, BS normal  Extremities: full range of motion  Neurology: normal strength and sensation  Psych: active, alert and oriented, affect appropriate       Health Maintenance Due   Topic Date Due   ??? Hepatitis C Screening  Jul 13, 1956   ??? MICROALBUMIN Q1  11/17/1967   ??? EYE EXAM RETINAL OR DILATED Q1  11/17/1967   ??? Pneumococcal 19-64 Medium Risk (1 of 1 - PPSV23) 11/16/1976   ??? DTaP/Tdap/Td series (1 - Tdap) 11/17/1978   ??? PAP AKA CERVICAL CYTOLOGY  11/17/1978   ??? BREAST CANCER SCRN MAMMOGRAM  11/17/2007   ??? FOBT Q 1 YEAR AGE 19-75  11/17/2007   ??? INFLUENZA AGE 5 TO ADULT  04/17/2015       Risk, benefits and potential costs of recommended health maintenance discussed.  Patient expressed understanding and deferred at this time.      Assessment and orders:       ICD-10-CM ICD-9-CM    1. Controlled type 2 diabetes mellitus without complication, without long-term current use of insulin (HCC) E11.9 250.00 COLLECTION CAPILLARY BLOOD SPECIMEN      AMB POC HEMOGLOBIN A1C      METABOLIC PANEL, COMPREHENSIVE      LIPID PANEL      CBC WITH AUTOMATED DIFF      HM DIABETES FOOT EXAM      CANCELED: AMB POC URINE, MICROALBUMIN, SEMIQUANT (3 RESULTS)   2. Essential hypertension I10 401.9 METABOLIC PANEL, COMPREHENSIVE  TSH 3RD GENERATION   3. Mixed hyperlipidemia E78.2 272.2 METABOLIC PANEL, COMPREHENSIVE      LIPID PANEL   4. Vitamin D deficiency E55.9 268.9 VITAMIN D, 25 HYDROXY    5. Abnormal urine odor R82.90 791.9 AMB POC URINALYSIS DIP STICK MANUAL W/O MICRO      CULTURE, URINE         Plan of care:  Diagnoses were discussed in detail with patient.   Medication risks/benefits/side effects discussed with patient.   All of the patient's questions were addressed and answered to apparent satisfaction. The patient understands and agrees with our plan of care.  The patient knows to call back if they have questions about the plan of care or if symptoms change.  The patient received an After-Visit Summary which contains VS, diagnoses, orders, allergy and medication lists.      Patient Care Team:  Leola Brazil, MD as PCP - General (Family Practice)  Darryll Capers, MD (Rheumatology)  Earleen Reaper, MD (Otolaryngology)    Follow-up Disposition:  Return in about 6 months (around 03/26/2016), or if symptoms worsen or fail to improve.    Future Appointments  Date Time Provider Department Center   10/23/2015 5:00 PM [facility] 1 [facility]       Signed By: Leola Brazil, MD     October 01, 2015

## 2015-09-27 NOTE — Patient Instructions (Signed)
Learning About Diabetes Food Guidelines  Your Care Instructions  Meal planning is important to manage diabetes. It helps keep your blood sugar at a target level (which you set with your doctor). You don't have to eat special foods. You can eat what your family eats, including sweets once in a while. But you do have to pay attention to how often you eat and how much you eat of certain foods.  You may want to work with a dietitian or a certified diabetes educator (CDE) to help you plan meals and snacks. A dietitian or CDE can also help you lose weight if that is one of your goals.  What should you know about eating carbs?  Managing the amount of carbohydrate (carbs) you eat is an important part of healthy meals when you have diabetes. Carbohydrate is found in many foods.  ?? Learn which foods have carbs. And learn the amounts of carbs in different foods.  ?? Bread, cereal, pasta, and rice have about 15 grams of carbs in a serving. A serving is 1 slice of bread (1 ounce), ?? cup of cooked cereal, or 1/3 cup of cooked pasta or rice.  ?? Fruits have 15 grams of carbs in a serving. A serving is 1 small fresh fruit, such as an apple or orange; ?? of a banana; ?? cup of cooked or canned fruit; ?? cup of fruit juice; 1 cup of melon or raspberries; or 2 tablespoons of dried fruit.  ?? Milk and no-sugar-added yogurt have 15 grams of carbs in a serving. A serving is 1 cup of milk or 2/3 cup of no-sugar-added yogurt.  ?? Starchy vegetables have 15 grams of carbs in a serving. A serving is ?? cup of mashed potatoes or sweet potato; 1 cup winter squash; ?? of a small baked potato; ?? cup of cooked beans; or ?? cup cooked corn or green peas.  ?? Learn how much carbs to eat each day and at each meal. A dietitian or CDE can teach you how to keep track of the amount of carbs you eat. This is called carbohydrate counting.  ?? If you are not sure how to count carbohydrate grams, use the Plate  Method to plan meals. It is a good, quick way to make sure that you have a balanced meal. It also helps you spread carbs throughout the day.  ?? Divide your plate by types of foods. Put non-starchy vegetables on half the plate, meat or other protein food on one-quarter of the plate, and a grain or starchy vegetable in the final quarter of the plate. To this you can add a small piece of fruit and 1 cup of milk or yogurt, depending on how many carbs you are supposed to eat at a meal.  ?? Try to eat about the same amount of carbs at each meal. Do not "save up" your daily allowance of carbs to eat at one meal.  ?? Proteins have very little or no carbs per serving. Examples of proteins are beef, chicken, turkey, fish, eggs, tofu, cheese, cottage cheese, and peanut butter. A serving size of meat is 3 ounces, which is about the size of a deck of cards. Examples of meat substitute serving sizes (equal to 1 ounce of meat) are 1/4 cup of cottage cheese, 1 egg, 1 tablespoon of peanut butter, and ?? cup of tofu.  How can you eat out and still eat healthy?  ?? Learn to estimate the serving sizes of foods that have   carbohydrate. If you measure food at home, it will be easier to estimate the amount in a serving of restaurant food.  ?? If the meal you order has too much carbohydrate (such as potatoes, corn, or baked beans), ask to have a low-carbohydrate food instead. Ask for a salad or green vegetables.  ?? If you use insulin, check your blood sugar before and after eating out to help you plan how much to eat in the future.  ?? If you eat more carbohydrate at a meal than you had planned, take a walk or do other exercise. This will help lower your blood sugar.  What else should you know?  ?? Limit saturated fat, such as the fat from meat and dairy products. This is a healthy choice because people who have diabetes are at higher risk of heart disease. So choose lean cuts of meat and nonfat or low-fat dairy  products. Use olive or canola oil instead of butter or shortening when cooking.  ?? Don't skip meals. Your blood sugar may drop too low if you skip meals and take insulin or certain medicines for diabetes.  ?? Check with your doctor before you drink alcohol. Alcohol can cause your blood sugar to drop too low. Alcohol can also cause a bad reaction if you take certain diabetes medicines.  Follow-up care is a key part of your treatment and safety. Be sure to make and go to all appointments, and call your doctor if you are having problems. It's also a good idea to know your test results and keep a list of the medicines you take.  Where can you learn more?  Go to http://www.healthwise.net/GoodHelpConnections.  Enter I147 in the search box to learn more about "Learning About Diabetes Food Guidelines."  Current as of: Feb 06, 2015  Content Version: 11.1  ?? 2006-2016 [employer]. Care instructions adapted under license by [employer] (which disclaims liability or warranty for this information). If you have questions about a medical condition or this instruction, always ask your healthcare professional. Healthwise, Incorporated disclaims any warranty or liability for your use of this information.

## 2015-09-27 NOTE — Progress Notes (Signed)
Reviewed record in preparation for visit and have necessary documentation    Body mass index is 39.31 kg/(m^2).    Health Maintenance Due   Topic Date Due   ??? Hepatitis C Screening  Apr 11, 1956   ??? FOOT EXAM Q1  11/17/1967   ??? MICROALBUMIN Q1  11/17/1967   ??? EYE EXAM RETINAL OR DILATED Q1  11/17/1967   ??? Pneumococcal 19-64 Medium Risk (1 of 1 - PPSV23) 11/16/1976   ??? DTaP/Tdap/Td series (1 - Tdap) 11/17/1978   ??? PAP AKA CERVICAL CYTOLOGY  11/17/1978   ??? BREAST CANCER SCRN MAMMOGRAM  11/17/2007   ??? FOBT Q 1 YEAR AGE 14-75  11/17/2007   ??? INFLUENZA AGE 34 TO ADULT  04/17/2015   ??? HEMOGLOBIN A1C Q6M  07/12/2015     Eye exam will be scheduled in a few days

## 2015-10-03 LAB — METABOLIC PANEL, COMPREHENSIVE
A-G Ratio: 2.2 (ref 1.1–2.5)
ALT (SGPT): 28 IU/L (ref 0–32)
AST (SGOT): 20 IU/L (ref 0–40)
Albumin: 4.3 g/dL (ref 3.5–5.5)
Alk. phosphatase: 54 IU/L (ref 39–117)
BUN/Creatinine ratio: 33 — ABNORMAL HIGH (ref 9–23)
BUN: 19 mg/dL (ref 6–24)
Bilirubin, total: 0.3 mg/dL (ref 0.0–1.2)
CO2: 25 mmol/L (ref 18–29)
Calcium: 9.8 mg/dL (ref 8.7–10.2)
Chloride: 98 mmol/L (ref 96–106)
Creatinine: 0.57 mg/dL (ref 0.57–1.00)
GFR est AA: 119 mL/min/{1.73_m2} (ref >59)
GFR est non-AA: 103 mL/min/{1.73_m2} (ref >59)
GLOBULIN, TOTAL: 2 g/dL (ref 1.5–4.5)
Glucose: 106 mg/dL — ABNORMAL HIGH (ref 65–99)
Potassium: 4.1 mmol/L (ref 3.5–5.2)
Protein, total: 6.3 g/dL (ref 6.0–8.5)
Sodium: 140 mmol/L (ref 134–144)

## 2015-10-03 LAB — CBC WITH AUTOMATED DIFF
ABS. BASOPHILS: 0 10*3/uL (ref 0.0–0.2)
ABS. EOSINOPHILS: 0.2 10*3/uL (ref 0.0–0.4)
ABS. IMM. GRANS.: 0 10*3/uL (ref 0.0–0.1)
ABS. MONOCYTES: 0.4 10*3/uL (ref 0.1–0.9)
ABS. NEUTROPHILS: 2.4 10*3/uL (ref 1.4–7.0)
Abs Lymphocytes: 1.7 10*3/uL (ref 0.7–3.1)
BASOPHILS: 0 %
EOSINOPHILS: 4 %
HCT: 38.4 % (ref 34.0–46.6)
HGB: 12.6 g/dL (ref 11.1–15.9)
IMMATURE GRANULOCYTES: 0 %
Lymphocytes: 36 %
MCH: 30.2 pg (ref 26.6–33.0)
MCHC: 32.8 g/dL (ref 31.5–35.7)
MCV: 92 fL (ref 79–97)
MONOCYTES: 9 %
NEUTROPHILS: 51 %
PLATELET: 249 10*3/uL (ref 150–379)
RBC: 4.17 x10E6/uL (ref 3.77–5.28)
RDW: 13.5 % (ref 12.3–15.4)
WBC: 4.6 10*3/uL (ref 3.4–10.8)

## 2015-10-03 LAB — LIPID PANEL
Cholesterol, total: 236 mg/dL — ABNORMAL HIGH (ref 100–199)
HDL Cholesterol: 59 mg/dL (ref >39)
LDL, calculated: 140 mg/dL — ABNORMAL HIGH (ref 0–99)
Triglyceride: 185 mg/dL — ABNORMAL HIGH (ref 0–149)
VLDL, calculated: 37 mg/dL (ref 5–40)

## 2015-10-03 LAB — VITAMIN D, 25 HYDROXY: VITAMIN D, 25-HYDROXY: 16.2 ng/mL — ABNORMAL LOW (ref 30.0–100.0)

## 2015-10-03 LAB — TSH 3RD GENERATION: TSH: 1.52 u[IU]/mL (ref 0.450–4.500)

## 2015-10-04 LAB — CULTURE, URINE

## 2015-10-10 MED ORDER — HYDROXYCHLOROQUINE 200 MG TAB
200 mg | ORAL_TABLET | ORAL | 3 refills | Status: DC
Start: 2015-10-10 — End: 2015-11-13

## 2015-10-10 MED ORDER — METFORMIN SR 500 MG 24 HR TABLET
500 mg | ORAL_TABLET | ORAL | 3 refills | Status: DC
Start: 2015-10-10 — End: 2015-11-13

## 2015-10-13 NOTE — Telephone Encounter (Signed)
-----   Message from Nonie Hoyer sent at 10/13/2015  4:27 PM EST -----  Regarding: Dr.Swan/ telephone  Contact: [phone]  Pt is requesting a call back regarding recent lab results. Pt's best contact number is [phone].

## 2015-10-16 ENCOUNTER — Encounter

## 2015-10-16 MED ORDER — ERGOCALCIFEROL (VITAMIN D2) 50,000 UNIT CAP
1250 mcg (50,000 unit) | ORAL_CAPSULE | ORAL | 0 refills | Status: DC
Start: 2015-10-16 — End: 2015-12-08

## 2015-10-16 NOTE — Telephone Encounter (Signed)
Advised of lab results per Dr. Ernestine Conrad

## 2015-10-16 NOTE — Telephone Encounter (Signed)
Attempted to call. No answer. Message left.    Vitamin D low. Cholesterol elevated from prior lab values.   Rx for vitamin D supplement sent to pharmacy.   Work on low fat diet and exercise.   Recheck labs in 3 months. Letter printed.

## 2015-10-16 NOTE — Telephone Encounter (Signed)
Pt returned call. Please call back. [phone] (home)

## 2015-10-23 ENCOUNTER — Inpatient Hospital Stay: Admit: 2015-10-23 | Payer: BLUE CROSS/BLUE SHIELD | Attending: Family Medicine

## 2015-10-23 DIAGNOSIS — Z1231 Encounter for screening mammogram for malignant neoplasm of breast: Secondary | ICD-10-CM

## 2015-10-23 MED ORDER — HYDROCHLOROTHIAZIDE 25 MG TAB
25 mg | ORAL_TABLET | ORAL | 5 refills | Status: DC
Start: 2015-10-23 — End: 2015-11-13

## 2015-10-23 MED ORDER — LISINOPRIL 40 MG TAB
40 mg | ORAL_TABLET | ORAL | 5 refills | Status: DC
Start: 2015-10-23 — End: 2015-11-13

## 2015-11-13 NOTE — Telephone Encounter (Signed)
Last office visit 09/27/2015    Requesting 90 day supply with 4 refills

## 2015-11-16 MED ORDER — PRAMIPEXOLE 0.25 MG TAB
0.25 mg | ORAL_TABLET | Freq: Two times a day (BID) | ORAL | 0 refills | Status: DC
Start: 2015-11-16 — End: 2016-02-20

## 2015-11-16 MED ORDER — HYDROXYCHLOROQUINE 200 MG TAB
200 mg | ORAL_TABLET | Freq: Every day | ORAL | 0 refills | Status: DC
Start: 2015-11-16 — End: 2016-01-19

## 2015-11-16 MED ORDER — LISINOPRIL 40 MG TAB
40 mg | ORAL_TABLET | Freq: Every day | ORAL | 0 refills | Status: DC
Start: 2015-11-16 — End: 2016-02-20

## 2015-11-16 MED ORDER — METFORMIN SR 500 MG 24 HR TABLET
500 mg | ORAL_TABLET | Freq: Two times a day (BID) | ORAL | 0 refills | Status: DC
Start: 2015-11-16 — End: 2016-02-20

## 2015-11-16 MED ORDER — HYDROCHLOROTHIAZIDE 25 MG TAB
25 mg | ORAL_TABLET | Freq: Every day | ORAL | 0 refills | Status: DC
Start: 2015-11-16 — End: 2016-02-20

## 2015-12-08 MED ORDER — ERGOCALCIFEROL (VITAMIN D2) 50,000 UNIT CAP
1250 mcg (50,000 unit) | ORAL_CAPSULE | ORAL | 0 refills | Status: DC
Start: 2015-12-08 — End: 2016-06-10

## 2015-12-08 NOTE — Telephone Encounter (Signed)
Can we call to get her an appt to f/u chronic conditions with Dr. Ernestine ConradSwan? He started her on this high dose vitamin D 3 months ago with plans to recheck the level in 3 months and determine what dose she should continue. Thanks!

## 2016-01-19 NOTE — Telephone Encounter (Signed)
-----   Message from Rolland BimlerMarquia S Valentine sent at 01/19/2016 11:12 AM EDT -----  Regarding: Dr. Ernestine ConradSwan/ Telephone  Patient would like a call back regarding refill on her Hydrodxy, stated needs to be corrected. Contact is  is [phone]

## 2016-01-19 NOTE — Telephone Encounter (Signed)
Patient called stating she just realized her script written on 11/16/2015 was 1 tablet daily. Patient states she has been taking 1 tablet twice daily for a long time. She has started to run out of medication. [facility] and a new 90 day supply sent to mail order pharmacy.

## 2016-01-21 MED ORDER — HYDROXYCHLOROQUINE 200 MG TAB
200 mg | ORAL_TABLET | Freq: Two times a day (BID) | ORAL | 0 refills | Status: DC
Start: 2016-01-21 — End: 2016-06-14

## 2016-01-21 MED ORDER — HYDROXYCHLOROQUINE 200 MG TAB
200 mg | ORAL_TABLET | Freq: Two times a day (BID) | ORAL | 0 refills | Status: DC
Start: 2016-01-21 — End: 2016-01-21

## 2016-01-24 NOTE — Telephone Encounter (Signed)
Per Dr. Ernestine ConradSwan:  Please call patient. She needs to schedule a follow up appointment with fasting lab work    Attempted to call. No answer. Message left.

## 2016-01-24 NOTE — Telephone Encounter (Signed)
-----   Message from Stephannie Peterserri T [place] sent at 01/23/2016  5:50 PM EDT -----  Regarding: Dr. Barkley BoardsSwan/Refill  Pt is out of medication and requests refill of "Plaquenil" 2 per day, to [facility] on [facility].  Pt also requests a two week Rx of "Plaquenil" two per day, [facility] [phone] Fax [phone].  Best contact [phone].

## 2016-01-24 NOTE — Telephone Encounter (Signed)
Attempted to call. No answer. Message left.

## 2016-02-21 ENCOUNTER — Encounter: Admit: 2016-02-21 | Discharge: 2016-02-21 | Payer: PRIVATE HEALTH INSURANCE

## 2016-02-21 MED ORDER — HYDROCHLOROTHIAZIDE 25 MG TAB
25 mg | ORAL_TABLET | ORAL | 0 refills | Status: DC
Start: 2016-02-21 — End: 2016-04-29

## 2016-02-21 MED ORDER — PRAMIPEXOLE 0.25 MG TAB
0.25 mg | ORAL_TABLET | ORAL | 0 refills | Status: DC
Start: 2016-02-21 — End: 2016-04-29

## 2016-02-21 MED ORDER — LISINOPRIL 40 MG TAB
40 mg | ORAL_TABLET | ORAL | 0 refills | Status: DC
Start: 2016-02-21 — End: 2016-04-29

## 2016-02-21 MED ORDER — METFORMIN SR 500 MG 24 HR TABLET
500 mg | ORAL_TABLET | ORAL | 0 refills | Status: DC
Start: 2016-02-21 — End: 2016-04-29

## 2016-02-22 ENCOUNTER — Telehealth

## 2016-02-23 LAB — URINALYSIS W/ RFLX MICROSCOPIC
Bilirubin: NEGATIVE
Blood: NEGATIVE
Glucose: NEGATIVE
Ketone: NEGATIVE
Nitrites: NEGATIVE
Protein: NEGATIVE
Specific Gravity: 1.013 (ref 1.005–1.030)
Urobilinogen: 0.2 mg/dL (ref 0.2–1.0)
pH (UA): 6 (ref 5.0–7.5)

## 2016-02-23 LAB — MICROSCOPIC EXAMINATION: Casts: NONE SEEN /lpf

## 2016-02-24 LAB — CULTURE, URINE
Urine Culture, Routine: NO GROWTH
Urine Culture, Routine: NO GROWTH

## 2016-03-07 ENCOUNTER — Inpatient Hospital Stay: Admit: 2016-03-11 | Payer: BLUE CROSS/BLUE SHIELD

## 2016-03-07 ENCOUNTER — Ambulatory Visit: Admit: 2016-03-07 | Discharge: 2016-03-07 | Payer: PRIVATE HEALTH INSURANCE | Attending: Family Medicine

## 2016-03-07 DIAGNOSIS — Z01419 Encounter for gynecological examination (general) (routine) without abnormal findings: Secondary | ICD-10-CM

## 2016-03-07 MED ORDER — DIPHTH,PERTUS(AC)TETANUS VAC(PF) 2 LF-(5-3-5MCG)-5 LF/0.5ML IM SYR
2 Lf-(.5-5-3-5 mcg)-5Lf/0.5 mL | Freq: Once | INTRAMUSCULAR | 0 refills | Status: AC
Start: 2016-03-07 — End: 2016-03-07

## 2016-03-07 MED ORDER — PNEUMOCOCCAL 13-VAL CONJ VACCINE-DIP CRM (PF) 0.5 ML IM SYRINGE
0.5 mL | INJECTION | INTRAMUSCULAR | 0 refills | Status: DC
Start: 2016-03-07 — End: 2016-08-26

## 2016-03-07 NOTE — Progress Notes (Signed)
CC: [facility]    HPI: Pt is a 60 y.o. female [facility] She has also noticed some SOB on and off for the past 6 months. This happens after she has been talking for a long time at work. No cough, CP,  fevers or wheezing. Symptoms are not worse with exertion. She works as an Pharmacist, [employer] and has checked her pulse ox at work when that happens and it is normally in the low 90's. She does not smoke and no one smokes around her. No h/o asthma, occupational exposures. She does have Lupus and is taking Plaquenil.     Last pap: Can't remember  History of abnormal paps?: Abnormal pap in her 20's but since then always normal  Abnormal vaginal bleeding or discharge?: No  Desire to be tested for STDs today?: No  LMP: 11/17/2014  Last mammogram: 10/2015, normal  Self breast checks?: Yes  New lumps or bumps?: No  Family history of ovarian, uterine or breast cancer?: No  Pt denies any physical, emotional or verbal abuse. Denies      Past Medical History:   Diagnosis Date   ??? Anxiety    ??? Diabetes (HCC)    ??? Hypertension    ??? Lupus (HCC)    ??? Restless leg syndrome        Family History   Problem Relation Age of Onset   ??? Hypertension Mother    ??? Hypertension Father        Social History   Substance Use Topics   ??? Smoking status: Never Smoker   ??? Smokeless tobacco: Never Used   ??? Alcohol use 0.0 oz/week     0 Glasses of wine per week         PE:  Visit Vitals   ??? BP 111/67 (BP 1 Location: Right arm, BP Patient Position: Sitting)   ??? Pulse 91   ??? Temp 97.4 ??F (36.3 ??C) (Oral)   ??? Resp 16   ??? Ht 5\' 4"  (1.626 m)   ??? Wt 230 lb 9.6 oz (104.6 kg)   ??? SpO2 93%   ??? BMI 39.58 kg/m2     Gen: Pt sitting in chair, in NAD  Head: Normocephalic, atraumatic  Eyes: Sclera anicteric, EOM grossly intact, PERRL  Throat: MMM, normal lips, tongue, teeth and gums  Neck: Supple, no LAD, no thyromegaly or carotid bruits  CVS: Normal S1, S2, no m/r/g  Resp: CTAB, no wheezes or rales  Breasts: Symmetric, no lesions or nipple discharge. No masses or  tenderness on palpation.   Abd: Soft, non-tender, non-distended  GU: Normal external female genitalia. Vaginal mucosa pink, moist. Small amount clear discharge. Cervix without visible lesions. No abnormalities palpated on bimanual exam but limited 2/2 pt body habitus  Extrem: Atraumatic, no cyanosis or edema  Pulses: 2+   Skin: Warm, dry  Neuro: Alert, oriented, appropriate    A/P: Pt is a 60 y.o. female [facility]. Also with cough and decreased O2 sats of unknown etiology. She is 93% today and has always been in the high 90's in the past  - Pap with HPV. If normal will plan to repeat in 5 years  - Given h/o Lupus and objective decrease in O2 sats will get CXR as initial evaluation. Pending results will discuss options for further work-up with patient. Will call her with results when available (no Rad tech in house today, she will come back for test)  - Will order chronic conditions labs  and have pt RTC in 1 week to discuss them      Discussed diagnoses in detail with patient.   Medication risks/benefits/side effects discussed with patient.   All of the patient's questions were addressed. The patient understands and agrees with our plan of care.  The patient knows to call back if they are unsure of or forget any changes we discussed today or if the symptoms change.  The patient received an After-Visit Summary which contains VS, orders, medication list and allergy list. This can be used as a "mini-medical record" should they have to seek medical care while out of town.    Current Outpatient Prescriptions on File Prior to Visit   Medication Sig Dispense Refill   ??? metFORMIN ER (GLUCOPHAGE XR) 500 mg tablet TAKE 2 TABLETS TWICE A DAY 360 Tab 0   ??? pramipexole (MIRAPEX) 0.25 mg tablet TAKE 1 TABLET TWICE A DAY 180 Tab 0   ??? hydroCHLOROthiazide (HYDRODIURIL) 25 mg tablet TAKE 1 TABLET DAILY 90 Tab 0   ??? lisinopril (PRINIVIL, ZESTRIL) 40 mg tablet TAKE 1 TABLET DAILY 90 Tab 0    ??? hydroxychloroquine (PLAQUENIL) 200 mg tablet Take 1 Tab by mouth two (2) times a day. 180 Tab 0   ??? citalopram (CELEXA) 40 mg tablet TAKE 1 & 1/2 TABLETS BY MOUTH EVERY DAY 45 Tab 5   ??? loratadine (CLARITIN) 10 mg tablet Take 10 mg by mouth.     ??? fluticasone (FLONASE) 50 mcg/actuation nasal spray 2 Sprays by Both Nostrils route daily. 1 Bottle 0   ??? ergocalciferol (VITAMIN D2) 50,000 unit capsule Take 1 Cap by mouth every seven (7) days. 4 Cap 0   ??? mometasone (NASONEX) 50 mcg/actuation nasal spray 2 Sprays daily.       No current facility-administered medications on file prior to visit.

## 2016-03-07 NOTE — ACP (Advance Care Planning) (Signed)
Information was given to pt on Advanced Directives, Living Will

## 2016-03-07 NOTE — Patient Instructions (Signed)
Pap Test: Care Instructions  Your Care Instructions  The Pap test (also called a Pap smear) is a screening test for cancer of the cervix, which is the lower part of the uterus that opens into the vagina. The test can help your doctor find early changes in the cells that could lead to cancer.  The sample of cells taken during your test has been sent to a lab so that an expert can look at the cells. It usually takes a week or two to get the results back.  Follow-up care is a key part of your treatment and safety. Be sure to make and go to all appointments, and call your doctor if you are having problems. It's also a good idea to know your test results and keep a list of the medicines you take.  What do the results mean?  ?? A normal result means that the test did not find any abnormal cells in the sample.  ?? An abnormal result can mean many things. Most of these are not cancer. The results of your test may be abnormal because:  ?? You have an infection of the vagina or cervix, such as a yeast infection.  ?? You have an IUD (intrauterine device for birth control).  ?? You have low estrogen levels after menopause that are causing the cells to change.  ?? You have cell changes that may be a sign of precancer or cancer. The results are ranked based on how serious the changes might be.  There are many other reasons why you might not get a normal result. If the results were abnormal, you may need to get another test within a few weeks or months. If the results show changes that could be a sign of cancer, you may need a test called a colposcopy, which provides a more complete view of the cervix.  Sometimes the lab cannot use the sample because it does not contain enough cells or was not preserved well. If so, you may need to have the test again. This is not common, but it does happen from time to time.  When should you call for help?  Watch closely for changes in your health, and be sure to contact your doctor if:   ?? You have vaginal bleeding or pain for more than 2 days after the test. It is normal to have a small amount of bleeding for a day or two after the test.  Where can you learn more?  Go to http://www.healthwise.net/GoodHelpConnections.  Enter U972 in the search box to learn more about "Pap Test: Care Instructions."  Current as of: April 11, 2015  Content Version: 11.3  ?? 2006-2017 [employer]. Care instructions adapted under license by [employer] (which disclaims liability or warranty for this information). If you have questions about a medical condition or this instruction, always ask your healthcare professional. [employer] disclaims any warranty or liability for your use of this information.

## 2016-03-07 NOTE — Progress Notes (Signed)
Reviewed record in preparation for visit and have necessary documentation  Pt did not bring medication to office visit for review  opportunity was given for questions  Goals that were addressed and/or need to be completed during or after this appointment include     Health Maintenance Due   Topic Date Due   ??? Hepatitis C Screening  September 28, 1955   ??? MICROALBUMIN Q1  11/17/1967   ??? Pneumococcal 19-64 Medium Risk (1 of 1 - PPSV23) 11/16/1976   ??? DTaP/Tdap/Td series (1 - Tdap) 11/17/1978   ??? PAP AKA CERVICAL CYTOLOGY  11/17/1978   ??? FOBT Q 1 YEAR AGE 35-75  11/17/2007   ??? HEMOGLOBIN A1C Q6M  03/26/2016     - check for functional glucose monitor and record keeping system  Not checking blood sugars  Pt was given BS record log to document home readings and return to office for review  Diabetic Bundle:  LDL- 140  A1C- 6.1  BP-  Smoking? No  Anticoagulation medication? Yes  Eye exam dilated? Yes  Foot exam? Yes

## 2016-03-07 NOTE — ACP (Advance Care Planning) (Signed)
Information was given to pt on Advanced Directives, Living Will

## 2016-03-08 LAB — HEPATITIS C ANTIBODY: HCV Ab: <0.1 s/co ratio (ref 0.0–0.9)

## 2016-03-08 LAB — HEPATITIS C AB: Hep C Virus Ab: <0.1 s/co ratio (ref 0.0–0.9)

## 2016-03-08 LAB — LIPID PANEL
Cholesterol, total: 193 mg/dL (ref 100–199)
HDL Cholesterol: 57 mg/dL (ref >39)
LDL, calculated: 85 mg/dL (ref 0–99)
Triglyceride: 255 mg/dL — ABNORMAL HIGH (ref 0–149)
VLDL, calculated: 51 mg/dL — ABNORMAL HIGH (ref 5–40)

## 2016-03-08 LAB — MICROALBUMIN, UR, RAND W/ MICROALB/CREAT RATIO
Creatinine, urine random: 87.8 mg/dL
Microalb/Creat ratio (ug/mg creat.): 20 mg/g creat (ref 0.0–30.0)
Microalbumin, urine: 17.6 ug/mL

## 2016-03-08 LAB — METABOLIC PANEL, BASIC
BUN/Creatinine ratio: 31 — ABNORMAL HIGH (ref 9–23)
BUN: 20 mg/dL (ref 6–24)
CO2: 22 mmol/L (ref 18–29)
Calcium: 9.4 mg/dL (ref 8.7–10.2)
Chloride: 100 mmol/L (ref 96–106)
Creatinine: 0.64 mg/dL (ref 0.57–1.00)
GFR est AA: 114 mL/min/{1.73_m2} (ref >59)
GFR est non-AA: 99 mL/min/{1.73_m2} (ref >59)
Glucose: 96 mg/dL (ref 65–99)
Potassium: 4.4 mmol/L (ref 3.5–5.2)
Sodium: 141 mmol/L (ref 134–144)

## 2016-03-08 LAB — HEMOGLOBIN A1C WITH EAG
Estimated average glucose: 131 mg/dL
Hemoglobin A1c: 6.2 % — ABNORMAL HIGH (ref 4.8–5.6)

## 2016-03-08 LAB — DIABETES PATIENT EDUCATION

## 2016-03-13 ENCOUNTER — Encounter: Admit: 2016-03-13

## 2016-03-13 DIAGNOSIS — R0602 Shortness of breath: Secondary | ICD-10-CM

## 2016-03-15 ENCOUNTER — Telehealth

## 2016-03-15 NOTE — Telephone Encounter (Signed)
Patient is requesting a call back with her chest x-ray results at [phone].

## 2016-03-15 NOTE — Telephone Encounter (Signed)
Results of pap also back and are normal. Will give her these results as well when she calls back. Plan for next pap in 5 years.

## 2016-03-15 NOTE — Telephone Encounter (Signed)
Please advise.

## 2016-03-15 NOTE — Telephone Encounter (Signed)
Returned pt's call. Left message to call back. CXR normal. Next step would be to send her to Pulmonology for further work-up given her increased SOB and decreased O2 sats. If she is amenable to this I will put in the order.

## 2016-03-18 NOTE — Addendum Note (Signed)
Addended by: Eugene GarnetESQUIVEL, Shyteria Lewis on: 03/18/2016 02:05 PM      Modules accepted: Orders

## 2016-03-18 NOTE — Telephone Encounter (Signed)
Pt returned call. Please call back. [phone] (home)

## 2016-03-18 NOTE — Telephone Encounter (Signed)
Pulmonology referral placed.

## 2016-03-18 NOTE — Telephone Encounter (Signed)
Called patient and gave her the pap results and the x-ray results.  She states she is willing to go to pulmonology.

## 2016-04-29 MED ORDER — LISINOPRIL 40 MG TAB
40 mg | ORAL_TABLET | ORAL | 1 refills | Status: DC
Start: 2016-04-29 — End: 2016-10-26

## 2016-04-29 MED ORDER — PRAMIPEXOLE 0.25 MG TAB
0.25 mg | ORAL_TABLET | ORAL | 1 refills | Status: DC
Start: 2016-04-29 — End: 2016-10-26

## 2016-04-29 MED ORDER — METFORMIN SR 500 MG 24 HR TABLET
500 mg | ORAL_TABLET | ORAL | 1 refills | Status: DC
Start: 2016-04-29 — End: 2016-10-26

## 2016-04-29 MED ORDER — HYDROCHLOROTHIAZIDE 25 MG TAB
25 mg | ORAL_TABLET | ORAL | 1 refills | Status: DC
Start: 2016-04-29 — End: 2016-10-26

## 2016-04-29 NOTE — Telephone Encounter (Signed)
Medications refilled as requested.

## 2016-06-10 ENCOUNTER — Ambulatory Visit: Admit: 2016-06-10 | Discharge: 2016-06-10 | Payer: PRIVATE HEALTH INSURANCE | Attending: Specialist

## 2016-06-10 DIAGNOSIS — I10 Essential (primary) hypertension: Secondary | ICD-10-CM

## 2016-06-10 NOTE — Progress Notes (Signed)
Angela Chavana J. [name], MD [facility]  [address]  [place], [place] [address]    Office [phone]  Fax [phone]  Cell [phone]        Angela Elliott is a 60 y.o. female  referred by Dr. Frances Elliott for evaluation of DOE.      Assessment  Encounter Diagnoses   Name Primary?   ??? Essential hypertension Yes   ??? Controlled type 2 diabetes mellitus without complication, without long-term current use of insulin (HCC)    ??? DOE (dyspnea on exertion)    ??? OSA (obstructive sleep apnea)    ??? Morbid obesity due to excess calories [facility])        Recommendations:    STEPHANNE Elliott has chornic class 2-3 DOE , unclear etiology. No underlying pulmonary pathology. Cardiacwise Angela Elliott and EKG are normal. Recent echo was essentially normal- will need to review Angela Elliott echo report and ideally review Angela Elliott images to truly ascertain for diastolic dysfunction. I do think that weight, deconditioning and OSA are big drivers of Angela Elliott dyspnea. Angela Elliott is at intermediate CAD risk- favor lexiscan cardiolite in the near future.     Have asked Angela Elliott to get records from Angela Elliott [place] hospitalization 3 years ago.     Phone follow up after reviewing tests    Subjective:  Angela Elliott in the setting of morbid obesity but no previous cardiac hx. Angela Elliott has had progressive DOE for the past few months. Pulmonary evaluation by Dr. Frances Elliott did not suggest an pulmonary pathology. Spirometry in July was normal. was normal. CXR was normal. Echo 04/24/16 revealed ef 60-65% with normal RV size. Angela Elliott was referred for sleep study evaluation.     3 years ago Angela Elliott suffered from Angela Elliott urosepsis complicated by respiratory failure with acute HF in [place]. Follow up cardiac evaluation was apparently unremarkable, no details available. Since that time Angela Elliott has been short of breath.     Angela Elliott will become SOB after talking for long periods of a time. Angela Elliott notes DOE walking up stairs but denies and chest discomfort. Patient is limited  from walking by knee pain. Angela Elliott has recently started a weight loss program.      Angela Elliott has OSA on CPAP but is unsure if the mask is helping. Angela Elliott continues to be fatigued throughout the day and wakes up a lot at night. Angela Elliott sleeps with Angela Elliott head and feet elevated. Patient denies any exertional chest pain, palpitations, syncope, orthopnea, edema or paroxysmal nocturnal dyspnea.    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    Angela Elliott does not smoke. Patient's 57 year old brother had a MI. Angela Elliott grandparents have both had strokes.     Cardiac risk factors   HTN yes  DM yes  Smoking no  Family hx of CAD yes- brother  Dyslipidemia yes  Lab Results   Component Value Date/Time    Cholesterol, total 193 03/07/2016 12:22 PM    HDL Cholesterol 57 03/07/2016 12:22 PM    LDL, calculated 85 03/07/2016 12:22 PM    VLDL, calculated 51 03/07/2016 12:22 PM    Triglyceride 255 03/07/2016 12:22 PM       Cardiac testing  No specialty comments available.    Past Medical History:   Diagnosis Date   ??? Anxiety    ??? Diabetes (HCC)    ??? Hypertension    ??? Lupus (HCC)    ???  Restless leg syndrome         Current Outpatient Prescriptions   Medication Sig Dispense Refill   ??? DULoxetine (CYMBALTA) 30 mg capsule      ??? metFORMIN ER (GLUCOPHAGE XR) 500 mg tablet TAKE 2 TABLETS TWICE A DAY 360 Tab 1   ??? hydroCHLOROthiazide (HYDRODIURIL) 25 mg tablet TAKE 1 TABLET DAILY 90 Tab 1   ??? pramipexole (MIRAPEX) 0.25 mg tablet TAKE 1 TABLET TWICE A DAY 180 Tab 1   ??? lisinopril (PRINIVIL, ZESTRIL) 40 mg tablet TAKE 1 TABLET DAILY 90 Tab 1   ??? hydroxychloroquine (PLAQUENIL) 200 mg tablet Take 1 Tab by mouth two (2) times a day. 180 Tab 0   ??? loratadine (CLARITIN) 10 mg tablet Take 10 mg by mouth.     ??? pneumococcal 13 val conj dip (PREVNAR 13, PF,) 0.5 mL syrg injection 0.5 mL by IntraMUSCular route PRIOR TO DISCHARGE for 1 dose. 1 Syringe 0   ??? mometasone (NASONEX) 50 mcg/actuation nasal spray 2 Sprays daily.      ??? fluticasone (FLONASE) 50 mcg/actuation nasal spray 2 Sprays by Both Nostrils route daily. 1 Bottle 0       Allergies   Allergen Reactions   ??? Reglan [Metoclopramide] Other (comments)     Other reaction(s): Muscle ache   Spontaneous arm movements   ??? Diclofenac Other (comments)     Elevated liver enzymes.      Occupational therapist    Review of Systems  Constitutional: Negative for fever, chills, malaise/fatigue and diaphoresis.   Respiratory: Negative for cough, hemoptysis, sputum production, and wheezing. Positive for DOE.  Cardiovascular: Negative for chest pain, palpitations, orthopnea, claudication, leg swelling and PND.  Gastrointestinal: Negative for heartburn, nausea, vomiting, blood in stool and melena.  Genitourinary: Negative for dysuria and flank pain.  Musculoskeletal: Negative for joint pain and back pain.  Skin: Negative for rash.  Neurological: Negative for focal weakness, seizures, loss of consciousness, weakness and headaches.  Endo/Heme/Allergies: Does not bruise/bleed easily.  Psychiatric/Behavioral: Negative for memory loss. The patient does not have insomnia.      Physical Elliott    Visit Vitals   ??? BP 110/70 (BP 1 Location: Left arm, BP Patient Position: Sitting)   ??? Pulse 81   ??? Resp 16   ??? Ht 5\' 4"  (1.626 m)   ??? Wt 228 lb 3.2 oz (103.5 kg)   ??? SpO2 97%   ??? BMI 39.17 kg/m2     Wt Readings from Last 3 Encounters:   06/10/16 228 lb 3.2 oz (103.5 kg)   03/07/16 230 lb 9.6 oz (104.6 kg)   09/27/15 229 lb (103.9 kg)      General - well developed well nourished  Neck - JVP normal, thyroid nl  Cardiac - normal S1, S2, no murmurs, rubs or gallops. No clicks  Vascular - carotids without bruits, radials, femorals and pedal pulses equal bilateral  Lungs - clear to auscultation bilaterals, no rales, wheezing or rhonchi  Abd - soft nontender, no HSM, no abd bruits  Extremities - no edema  Skin - no rash  Neuro - nonfocal  Psych - normal mood and affect    Cardiographics    EKG 06/10/16- SR, normal     Written by Joycelyn Ruaatherine Smiley, ScribeKick, as dictated by Doreene AdasShaival Deshon Koslowski, MD.  Fernand Parkinsatherine E Smiley

## 2016-06-14 MED ORDER — HYDROXYCHLOROQUINE 200 MG TAB
200 mg | ORAL_TABLET | Freq: Two times a day (BID) | ORAL | 1 refills | Status: DC
Start: 2016-06-14 — End: 2016-11-26

## 2016-06-14 NOTE — Telephone Encounter (Signed)
Rx request received for Plaquenil. She will need her yearly CBC and CMP in January for monitoring. Also need to ensure she is getting regular eye exams.

## 2016-06-14 NOTE — Telephone Encounter (Signed)
Last office visit 03/07/2016      Requesting 90 day supply with at least 4 refills

## 2016-06-26 ENCOUNTER — Encounter

## 2016-07-11 NOTE — Telephone Encounter (Signed)
Left a message for patient to return my call in regards to r/s lexiscan cardiolite.

## 2016-07-24 ENCOUNTER — Institutional Professional Consult (permissible substitution)

## 2016-07-24 ENCOUNTER — Institutional Professional Consult (permissible substitution): Admit: 2016-07-24 | Discharge: 2016-07-24 | Payer: PRIVATE HEALTH INSURANCE

## 2016-07-24 DIAGNOSIS — R0609 Other forms of dyspnea: Secondary | ICD-10-CM

## 2016-07-24 MED ORDER — REGADENOSON 0.4 MG/5 ML IV SYRINGE
0.4 mg/5 mL | Freq: Once | INTRAVENOUS | 0 refills | Status: AC
Start: 2016-07-24 — End: 2016-07-24

## 2016-07-24 NOTE — Progress Notes (Signed)
See scanned report. Dr. Milbert CoulterKapadia ordered study and Dr. Milbert CoulterKapadia read study. ID verified per protocol. Test and risks explained. Consent signed after all patient questions answered. Patient developed slight dyspnea during test. At 1 minute in recovery, patient without symptoms or complaints voiced.

## 2016-07-25 MED ORDER — NUCLEAR MEDICINE ISOTOPE
Freq: Once | 0 refills | Status: AC
Start: 2016-07-25 — End: 2016-07-25

## 2016-07-29 NOTE — Telephone Encounter (Signed)
Cardiac testing  Lexiscan Cardiolite 07/24/16 - normal perfusion. EF 73%.    Ms. Angela Elliott was seen as a new patient by Dr. Milbert CoulterKapadia on 06/10/16. Referred by Dr. Frances FurbishAthar for evaluation of DOE.      I have left a voicemail for Ms. Angela Elliott to notify her of normal stress test results. EF normal as well.  No additional cardiac testing is needed at this time.     Plan to recommend that she return for further evaluation with primary care providers.

## 2016-07-30 NOTE — Telephone Encounter (Signed)
Patient notified of results and recommendations. She voices understanding.

## 2016-07-30 NOTE — Telephone Encounter (Signed)
See previous encounter.

## 2016-07-30 NOTE — Telephone Encounter (Signed)
Patient returned call regarding test results, she can be reached at [phone]. Thank you

## 2016-08-22 ENCOUNTER — Encounter: Attending: Family Medicine

## 2016-08-26 ENCOUNTER — Ambulatory Visit: Admit: 2016-08-26 | Discharge: 2016-08-26 | Payer: PRIVATE HEALTH INSURANCE | Attending: Family Medicine

## 2016-08-26 DIAGNOSIS — Z01810 Encounter for preprocedural cardiovascular examination: Secondary | ICD-10-CM

## 2016-08-26 NOTE — Progress Notes (Signed)
Preoperative Evaluation    Date of Exam: 08/26/2016    Angela Elliott is a 60 y.o. female (DOB:05/04/1956) who presents for preoperative evaluation       Procedure/Surgery:Bilateral upper lid functional blepharoplasty  Date of Procedure/Surgery: 08/29/2016  Surgeon: Dr. Alfonzo BeersBearden  Hospital/Surgical Facility: [facility]  Primary Physician: Eugene GarnetMarissa Esquivel, MD  Latex Allergy: no    Problem List:     Patient Active Problem List    Diagnosis Date Noted   ??? Morbid obesity due to excess calories (HCC) 06/10/2016   ??? OSA (obstructive sleep apnea) 06/10/2016   ??? DOE (dyspnea on exertion) 06/10/2016   ??? Diabetes mellitus type 2, controlled (HCC) 07/22/2014   ??? Essential hypertension 07/22/2014      Medications:     Current Outpatient Prescriptions   Medication Sig   ??? hydroxychloroquine (PLAQUENIL) 200 mg tablet Take 1 Tab by mouth two (2) times a day.   ??? DULoxetine (CYMBALTA) 30 mg capsule    ??? metFORMIN ER (GLUCOPHAGE XR) 500 mg tablet TAKE 2 TABLETS TWICE A DAY   ??? hydroCHLOROthiazide (HYDRODIURIL) 25 mg tablet TAKE 1 TABLET DAILY   ??? pramipexole (MIRAPEX) 0.25 mg tablet TAKE 1 TABLET TWICE A DAY   ??? lisinopril (PRINIVIL, ZESTRIL) 40 mg tablet TAKE 1 TABLET DAILY   ??? loratadine (CLARITIN) 10 mg tablet Take 10 mg by mouth.   ??? mometasone (NASONEX) 50 mcg/actuation nasal spray 2 Sprays daily.   ??? fluticasone (FLONASE) 50 mcg/actuation nasal spray 2 Sprays by Both Nostrils route daily.     No current facility-administered medications for this visit.        Recent use of: No recent use of aspirin (ASA), NSAIDS or steroids    Anesthesia Complications: None  History of abnormal bleeding : None  History of Blood Transfusions: no  Health Care Directive or Living Will: no      Allergies:   Allergies   Allergen Reactions   ??? Reglan [Metoclopramide] Other (comments)     Other reaction(s): Muscle ache   Spontaneous arm movements   ??? Diclofenac Other (comments)     Elevated liver enzymes.         Medications:    Current Outpatient Prescriptions   Medication Sig Dispense Refill   ??? hydroxychloroquine (PLAQUENIL) 200 mg tablet Take 1 Tab by mouth two (2) times a day. 180 Tab 1   ??? DULoxetine (CYMBALTA) 30 mg capsule      ??? metFORMIN ER (GLUCOPHAGE XR) 500 mg tablet TAKE 2 TABLETS TWICE A DAY 360 Tab 1   ??? hydroCHLOROthiazide (HYDRODIURIL) 25 mg tablet TAKE 1 TABLET DAILY 90 Tab 1   ??? pramipexole (MIRAPEX) 0.25 mg tablet TAKE 1 TABLET TWICE A DAY 180 Tab 1   ??? lisinopril (PRINIVIL, ZESTRIL) 40 mg tablet TAKE 1 TABLET DAILY 90 Tab 1   ??? loratadine (CLARITIN) 10 mg tablet Take 10 mg by mouth.     ??? mometasone (NASONEX) 50 mcg/actuation nasal spray 2 Sprays daily.     ??? fluticasone (FLONASE) 50 mcg/actuation nasal spray 2 Sprays by Both Nostrils route daily. 1 Bottle 0         Past Medical History: has a past medical history of Anxiety; Diabetes (HCC); Hypertension; Lupus; and Restless leg syndrome.      Past Surgical History:  has a past surgical history that includes cesarean section (1989/1991); dilation and curettage (2013); and breast biopsy (Left).      Social History: reports that she has never smoked. She  has never used smokeless tobacco. She reports that she drinks alcohol. She reports that she does not use illicit drugs.      Immunizations:   Immunization History   Administered Date(s) Administered   ??? Influenza Vaccine (Quad) PF 08/26/2014       Review of Systems   Constitutional: Negative for chills and fever.   Respiratory: Negative for cough, shortness of breath and wheezing.    Cardiovascular: Negative for chest pain, palpitations and leg swelling.   Gastrointestinal: Negative for heartburn and nausea.   Skin: Negative for itching and rash.   Neurological: Negative for dizziness and headaches.       EXAM:   Visit Vitals   ??? BP 137/73   ??? Pulse 90   ??? Temp 98.5 ??F (36.9 ??C) (Oral)   ??? Resp 18   ??? Ht 5\' 4"  (1.626 m)   ??? Wt 219 lb (99.3 kg)   ??? SpO2 100%   ??? BMI 37.59 kg/m2       Physical Exam    Constitutional: She is well-developed, well-nourished, and in no distress.   HENT:   Head: Normocephalic and atraumatic.   Right Ear: External ear normal.   Left Ear: External ear normal.   Neck: Normal range of motion. Neck supple.   Cardiovascular: Normal rate and regular rhythm.    Pulmonary/Chest: Effort normal and breath sounds normal. No respiratory distress. She has no wheezes.   Neurological: She is alert. No cranial nerve deficit. Gait normal.   Skin: Skin is warm and dry.       DIAGNOSTICS:   None necessary      IMPRESSION:   Pt is an low acceptable candidate for this lwo risk surgery. Chronic conditions that may impact the pre-op, intra-op, and post-op courses include: Diabetes mellitus, hypertension      No orders of the defined types were placed in this encounter.        Bonne DoloresKimberly A Maxemiliano Riel, MD  Family Medicine Resident

## 2016-08-26 NOTE — Progress Notes (Signed)
Reviewed record in preparation for visit and have necessary documentation  Opportunity was given for questions  Goals that were addressed and/or need to be completed after this appointment include   Health Maintenance Due   Topic Date Due   ??? Pneumococcal 19-64 Medium Risk (1 of 1 - PPSV23) 11/16/1976   ??? DTaP/Tdap/Td series (1 - Tdap) 11/17/1978   ??? FOBT Q 1 YEAR AGE 58-75  11/17/2007   ??? Influenza Age 54 to Adult  04/16/2016   ??? HEMOGLOBIN A1C Q6M  09/06/2016   ??? FOOT EXAM Q1  09/26/2016

## 2016-10-14 ENCOUNTER — Encounter: Attending: Family Medicine

## 2016-10-18 ENCOUNTER — Ambulatory Visit: Admit: 2016-10-18 | Discharge: 2016-10-18 | Payer: PRIVATE HEALTH INSURANCE | Attending: Family Medicine

## 2016-10-18 DIAGNOSIS — R5383 Other fatigue: Secondary | ICD-10-CM

## 2016-10-18 MED ORDER — WHEAT DEXTRIN 3 GRAM/3.5 GRAM ORAL POWDER PACKET
3 gram/.5 gram | PACK | ORAL | 5 refills | Status: DC
Start: 2016-10-18 — End: 2017-05-29

## 2016-10-18 MED ORDER — DULOXETINE 30 MG CAP, DELAYED RELEASE
30 mg | ORAL_CAPSULE | Freq: Every day | ORAL | 0 refills | Status: DC
Start: 2016-10-18 — End: 2016-10-18

## 2016-10-18 MED ORDER — WHEAT DEXTRIN 3 GRAM/3.5 GRAM ORAL POWDER PACKET
3 gram/.5 gram | PACK | ORAL | 4 refills | Status: DC
Start: 2016-10-18 — End: 2016-10-18

## 2016-10-18 MED ORDER — DULOXETINE 30 MG CAP, DELAYED RELEASE
30 mg | ORAL_CAPSULE | Freq: Every day | ORAL | 0 refills | Status: DC
Start: 2016-10-18 — End: 2016-11-22

## 2016-10-18 MED ORDER — BENZONATATE 200 MG CAP
200 mg | ORAL_CAPSULE | Freq: Three times a day (TID) | ORAL | 0 refills | Status: AC | PRN
Start: 2016-10-18 — End: 2016-10-25

## 2016-10-18 NOTE — Progress Notes (Signed)
[facility] [facility]     Outpatient Resident Progress Note    Encounter Date: 10/18/2016    Chief Complaint   Patient presents with   ??? Fatigue   ??? Dizziness   ??? Medication Evaluation   ??? Follow Up Chronic Condition     HTN, DM2, Fibromyalgia       History of Present Illness    Patient is a 61 y.o. female, who presents to clinic for 2 days of fatigue, dizziness, and watery diarrhea. Patient states that the diarrhea is brown, with no evidence of blood, had about 4 to 5 watery BM a day. Appetite is intact, and is keeping proper hydration with water and Gatorade. She denies tingling sensation, fever, nausea, vomiting, CP, SOB, abdominal pain, or any other complains at this moment. She reports being treated with Augmenting for 10 days for bacterial sinusitis, finished about 5 days ago. This medication was prescribed during an ED encounter in [facility].     Patient have been treated for Fibromyalgia for more than a year by an Endocrinologist in [facility]. Today, she expressed that she is interested on following up her fibromyalgia with our clinic. She also wants to follow up on her other chronic medical issues (DM2, HTN).      Review of Systems (Negative unless in bold)  Constitutional: Negative for chills, fever, malaise/fatigue and weight loss.   HENT: Negative for congestion, ear discharge, ear pain, hearing loss, nosebleeds, sore throat and tinnitus.    Eyes: Negative for blurred vision, double vision, photophobia, pain, discharge and redness.   Respiratory: Negative for cough, hemoptysis, sputum production, shortness of breath, wheezing and stridor.    Cardiovascular: Negative for chest pain, palpitations, orthopnea, claudication, leg swelling and PND.   Gastrointestinal: Negative for abdominal pain, blood in stool, constipation, diarrhea, heartburn, melena, nausea and vomiting.   Genitourinary: Negative for dysuria, flank pain, frequency, hematuria and urgency.    Musculoskeletal: Negative for back pain, falls, joint pain, myalgias and neck pain.   Skin: Negative for itching and rash.   Neurological: Negative for dizziness, tingling, tremors, sensory change, speech change, focal weakness, seizures, loss of consciousness, weakness and headaches.   Endo/Heme/Allergies: Negative for environmental allergies and polydipsia. Does not bruise/bleed easily.   Psychiatric/Behavioral: Negative for depression, hallucinations, memory loss, substance abuse and suicidal ideas. The patient is not nervous/anxious and does not have insomnia.      Allergies - reviewed:   Allergies   Allergen Reactions   ??? Reglan [Metoclopramide] Other (comments)     Other reaction(s): Muscle ache   Spontaneous arm movements   ??? Diclofenac Other (comments)     Elevated liver enzymes.       Medications - reviewed:   Current Outpatient Prescriptions   Medication Sig   ??? benzonatate (TESSALON) 200 mg capsule Take 1 Cap by mouth three (3) times daily as needed for Cough for up to 7 days.   ??? Wheat Dextrin (BENEFIBER CLEAR) 3 gram/3.5 gram pwpk Use as directed in the container.   ??? DULoxetine (CYMBALTA) 30 mg capsule Take 1 Cap by mouth daily.   ??? hydroxychloroquine (PLAQUENIL) 200 mg tablet Take 1 Tab by mouth two (2) times a day.   ??? metFORMIN ER (GLUCOPHAGE XR) 500 mg tablet TAKE 2 TABLETS TWICE A DAY   ??? hydroCHLOROthiazide (HYDRODIURIL) 25 mg tablet TAKE 1 TABLET DAILY   ??? pramipexole (MIRAPEX) 0.25 mg tablet TAKE 1 TABLET TWICE A DAY   ??? lisinopril (  PRINIVIL, ZESTRIL) 40 mg tablet TAKE 1 TABLET DAILY   ??? loratadine (CLARITIN) 10 mg tablet Take 10 mg by mouth.   ??? mometasone (NASONEX) 50 mcg/actuation nasal spray 2 Sprays daily.   ??? fluticasone (FLONASE) 50 mcg/actuation nasal spray 2 Sprays by Both Nostrils route daily.     No current facility-administered medications for this visit.        Past Medical History - reviewed:  Past Medical History:   Diagnosis Date   ??? Anxiety    ??? Diabetes (HCC)     ??? Hypertension    ??? Lupus    ??? Restless leg syndrome        Objective  Visit Vitals   ??? BP 145/83   ??? Pulse 87   ??? Temp 97.3 ??F (36.3 ??C) (Oral)   ??? Resp 20   ??? Ht 5\' 4"  (1.626 m)   ??? Wt 222 lb 6.4 oz (100.9 kg)   ??? SpO2 97%   ??? BMI 38.17 kg/m2     Body mass index is 38.17 kg/(m^2).    Physical Exam  Constitutional: Oriented to person, place, and time and well-developed, well-nourished, and in no distress. Obese.  HENT:   Head: Normocephalic and atraumatic.   Right Ear: External ear normal.   Left Ear: External ear normal.   Nose: Nose normal.   Mouth/Throat: Oropharynx is clear and moist. No oropharyngeal exudate.   Eyes: Conjunctivae and EOM are normal. Pupils are equal, round, and reactive to light. Right eye exhibits no discharge. Left eye exhibits no discharge. No scleral icterus.   Neck: Normal range of motion. Neck supple. No JVD present. No tracheal deviation present. No thyromegaly present.   Lymphadenopathy: no cervical adenopathy.   Cardiovascular: Normal rate, regular rhythm, normal heart sounds and intact distal pulses.  Exam reveals no gallop and no friction rub. No murmur heard.  Pulmonary/Chest: Effort normal and breath sounds normal. No respiratory distress. No wheezes, no rales, no tenderness.   Abdominal: Soft. Increased bowel sounds. No distension and no mass. There is no tenderness. There is no rebound and no guarding.   Musculoskeletal: Normal range of motion. Exhibits no edema, tenderness or deformity.   Neurological: Alert and oriented to person, place, and time. Normal reflexes. No cranial nerve deficit. Gait normal. Coordination normal.   Skin: Skin is warm and dry. No rash noted. Not diaphoretic. No erythema. No pallor.   Psychiatric: Mood, memory, affect and judgment normal.   Nursing note and vitals reviewed.    Assessment / Plan   Ms. Kook is a 61 y.o. female with the following medical condition(s):    1. Fatigue, unspecified type   This might be related to the diarrhea the patient is presenting, but will work up to rule out other possible causes.   - CBC WITH AUTOMATED DIFF  - METABOLIC PANEL, COMPREHENSIVE  - TSH REFLEX TO T4    2. Diarrhea, unspecified type  Recent Abx treatment for bacterial sinusitis. Will work up for possible causes, including C diff colitis.  - C DIFFICILE TOXIN A & B BY EIA  - CULTURE, STOOL  - WBC, STOOL  - OVA & PARASITES, STOOL  - CBC WITH AUTOMATED DIFF  - METABOLIC PANEL, COMPREHENSIVE  - Wheat Dextrin (BENEFIBER CLEAR) 3 gram/3.5 gram pwpk; Use as directed in the container.  Dispense: 30 Packet; Refill: 5    3. Fibromyalgia  It was explained to the patient that she needs to be reevaluated in 1 month in  order to initiate long term medication for this condition. Will prescribe 1 month of Cymbalta, as patient as being managed.  - DULoxetine (CYMBALTA) 30 mg capsule; Take 1 Cap by mouth daily.  Dispense: 30 Cap; Refill: 0    4. Controlled type 2 diabetes mellitus without complication, without long-term current use of insulin (HCC)  Will recheck HgbA1C at this moment.   - HEMOGLOBIN A1C WITH EAG  - LIPID PANEL    5. Essential hypertension  - LIPID PANEL    6. Acute sinusitis treated with antibiotics in the past 60 days  No symptoms at this moment. Abx treatment a factor to consider on current clinical presentation.   - C DIFFICILE TOXIN A & B BY EIA  - CULTURE, STOOL  - WBC, STOOL  - OVA & PARASITES, STOOL  - CBC WITH AUTOMATED DIFF    7. Cough  - benzonatate (TESSALON) 200 mg capsule; Take 1 Cap by mouth three (3) times daily as needed for Cough for up to 7 days.  Dispense: 30 Cap; Refill: 0    8. Morbid obesity due to excess calories (HCC)  Body mass index is 38.17 kg/(m^2).Marland Kitchen. Discussed the patient's I have reviewed/discussed the above normal BMI with the patient.  I have recommended the following interventions: dietary management education, guidance, and counseling, encourage exercise and monitor weight .       Patient was advised to return to clinic in case of worsening of symptoms or fail to improve.    Follow-up Disposition:  Return in about 2 weeks (around 11/01/2016), or if symptoms worsen or fail to improve, for F/U elevated BP in clinic.      ?? I have discussed the diagnosis with the patient and the intended plan as seen in the above orders.  The patient has received an after-visit summary and questions were answered concerning future plans.  I have discussed medication side effects and warnings with the patient as well.      Patient/Plan discussed with Dr. Sanda KleinEsquivel (Attending Physician)    Albertine GratesEduardo Hernandez-Verge, MD  PGY-2 Family Medicine Resident  Encounter Date: 10/18/2016

## 2016-10-18 NOTE — Patient Instructions (Signed)
Fibromyalgia: Care Instructions  Your Care Instructions    Fibromyalgia is a painful condition that is not completely understood by medical experts. The cause of fibromyalgia is not known. It can make you feel tired and ache all over. It causes tender spots at specific points of the body that hurt only when you press on them. You may have trouble sleeping, as well as other symptoms. These problems can upset your work and home life.  Symptoms tend to come and go, although they may never go away completely. Fibromyalgia does not harm your muscles, joints, or organs.  Follow-up care is a key part of your treatment and safety. Be sure to make and go to all appointments, and call your doctor if you are having problems. It's also a good idea to know your test results and keep a list of the medicines you take.  How can you care for yourself at home?  ?? Exercise often. Walk, swim, or bike to help with pain and sleep problems and to make you feel better.  ?? Try to get a good night's sleep. Go to bed and get up at the same time each day, whether you feel rested or not. Make sure you have a good mattress and pillow.  ?? Reduce stress. Avoid things that cause you stress, if you can. If not, work at making them less stressful. Learn to use biofeedback, guided imagery, meditation, or other methods to relax.  ?? Make healthy changes. Eat a balanced diet, quit smoking, and limit alcohol and caffeine.  ?? Use a heating pad set on low or take warm baths or showers for pain. Using cold packs for up to 20 minutes at a time can also relieve pain. Put a thin cloth between the cold pack and your skin. A gentle massage might help too.  ?? Be safe with medicines. Take your medicines exactly as prescribed. Call your doctor if you think you are having a problem with your medicine. Your doctor may talk to you about taking antidepressant medicines. These medicines may improve sleep, relieve pain, and in some cases treat depression.   ?? Learn about fibromyalgia. This makes coping easier. Then, take an active role in your treatment.  ?? Think about joining a support group with others who have fibromyalgia to learn more and get support.  When should you call for help?  Watch closely for changes in your health, and be sure to contact your doctor if:  ? ?? You feel sad, helpless, or hopeless; lose interest in things you used to enjoy; or have other symptoms of depression.   ? ?? Your fibromyalgia symptoms get worse.   Where can you learn more?  Go to http://www.healthwise.net/GoodHelpConnections.  Enter V003 in the search box to learn more about "Fibromyalgia: Care Instructions."  Current as of: June 30, 2015  Content Version: 11.4  ?? 2006-2017 [employer]wise[employer]. Care instructions adapted under license by [employer] (which disclaims liability or warranty for this information). If you have questions about a medical condition or this instruction, always ask your healthcare professional. Healthwise, Incorporated disclaims any warranty or liability for your use of this information.

## 2016-10-18 NOTE — Progress Notes (Signed)
Health Maintenance Due   Topic Date Due   ??? Pneumococcal 19-64 Medium Risk (1 of 1 - PPSV23) 11/16/1976   ??? DTaP/Tdap/Td series (1 - Tdap) 11/17/1978   ??? FOBT Q 1 YEAR AGE 59-75  11/17/2007   ??? Influenza Age 78 to Adult  04/16/2016   ??? HEMOGLOBIN A1C Q6M  09/06/2016   ??? FOOT EXAM Q1  09/26/2016     There is no height or weight on file to calculate BMI.  1. Have you been to the ER, urgent care clinic since your last visit?  Hospitalized since your last visit?No    2. Have you seen or consulted any other health care providers outside of [facility] since your last visit?  Include any pap smears or colon screening. No  Reviewed record in preparation for visit and have necessary documentation  Pt did not bring medication to office visit for review  Information was given to pt on Advanced Directives, Living Will  Information was given on Shingles Vaccine  opportunity was given for questions  Goals that were addressed and/or need to be completed during or after this appointment include       - check for functional glucose monitor and record keeping system  Pt was given BS record log to document home readings and return to office for review  Diabetic Bundle:  LDL-85  A1C-6.2  BP-  Smoking?no  Anticoagulation medication? no  Eye exam dilated?  Foot exam?

## 2016-10-18 NOTE — Progress Notes (Signed)
I reviewed with the resident the medical history and the resident's findings on the physical examination.  I discussed with the resident the patient's diagnosis and concur with the plan.

## 2016-10-19 LAB — LIPID PANEL
Cholesterol, total: 212 mg/dL — ABNORMAL HIGH (ref 100–199)
HDL Cholesterol: 57 mg/dL (ref >39)
LDL, calculated: 101 mg/dL — ABNORMAL HIGH (ref 0–99)
Triglyceride: 268 mg/dL — ABNORMAL HIGH (ref 0–149)
VLDL, calculated: 54 mg/dL — ABNORMAL HIGH (ref 5–40)

## 2016-10-19 LAB — CBC WITH AUTOMATED DIFF
ABS. BASOPHILS: 0 10*3/uL (ref 0.0–0.2)
ABS. EOSINOPHILS: 0.2 10*3/uL (ref 0.0–0.4)
ABS. IMM. GRANS.: 0 10*3/uL (ref 0.0–0.1)
ABS. MONOCYTES: 0.3 10*3/uL (ref 0.1–0.9)
ABS. NEUTROPHILS: 2.5 10*3/uL (ref 1.4–7.0)
Abs Lymphocytes: 1.5 10*3/uL (ref 0.7–3.1)
BASOPHILS: 0 %
EOSINOPHILS: 5 %
HCT: 37.4 % (ref 34.0–46.6)
HGB: 12.3 g/dL (ref 11.1–15.9)
IMMATURE GRANULOCYTES: 0 %
Lymphocytes: 33 %
MCH: 30.1 pg (ref 26.6–33.0)
MCHC: 32.9 g/dL (ref 31.5–35.7)
MCV: 92 fL (ref 79–97)
MONOCYTES: 7 %
NEUTROPHILS: 55 %
PLATELET: 284 10*3/uL (ref 150–379)
RBC: 4.08 x10E6/uL (ref 3.77–5.28)
RDW: 14.2 % (ref 12.3–15.4)
WBC: 4.6 10*3/uL (ref 3.4–10.8)

## 2016-10-19 LAB — METABOLIC PANEL, COMPREHENSIVE
A-G Ratio: 2.3 — ABNORMAL HIGH (ref 1.2–2.2)
ALT (SGPT): 27 IU/L (ref 0–32)
AST (SGOT): 17 IU/L (ref 0–40)
Albumin: 4.6 g/dL (ref 3.5–5.5)
Alk. phosphatase: 52 IU/L (ref 39–117)
BUN/Creatinine ratio: 26 — ABNORMAL HIGH (ref 9–23)
BUN: 16 mg/dL (ref 6–24)
Bilirubin, total: 0.3 mg/dL (ref 0.0–1.2)
CO2: 23 mmol/L (ref 18–29)
Calcium: 9.4 mg/dL (ref 8.7–10.2)
Chloride: 102 mmol/L (ref 96–106)
Creatinine: 0.61 mg/dL (ref 0.57–1.00)
GFR est AA: 116 mL/min/{1.73_m2} (ref >59)
GFR est non-AA: 100 mL/min/{1.73_m2} (ref >59)
GLOBULIN, TOTAL: 2 g/dL (ref 1.5–4.5)
Glucose: 94 mg/dL (ref 65–99)
Potassium: 4.2 mmol/L (ref 3.5–5.2)
Protein, total: 6.6 g/dL (ref 6.0–8.5)
Sodium: 144 mmol/L (ref 134–144)

## 2016-10-19 LAB — HEMOGLOBIN A1C WITH EAG
Estimated average glucose: 134 mg/dL
Hemoglobin A1c: 6.3 % — ABNORMAL HIGH (ref 4.8–5.6)

## 2016-10-19 LAB — TSH REFLEX TO T4: TSH: 1.51 u[IU]/mL (ref 0.450–4.500)

## 2016-10-23 NOTE — Progress Notes (Signed)
Laboratory results noted. HgbA1C stable. Total cholesterol, Triglyceride, LDL and VLDL elevated. Will discussed statin therapy during next visit on 11/08/16 to try to achieve a LDL <75 as recommended for diabetic patients.     Albertine GratesEduardo Hernandez-Verge, MD  PGY-2 Family Medicine Resident

## 2016-10-24 LAB — C DIFFICILE TOXIN A & B BY EIA: C. difficile Toxins A+B: NEGATIVE

## 2016-10-25 LAB — WBC, STOOL: White blood cells, stool: NONE SEEN

## 2016-10-25 LAB — OVA & PARASITES, STOOL: Ova & Parasite exam: NONE SEEN

## 2016-10-27 LAB — CULTURE, STOOL: E coli Shiga toxin: NEGATIVE

## 2016-10-28 MED ORDER — METFORMIN SR 500 MG 24 HR TABLET
500 mg | ORAL_TABLET | ORAL | 1 refills | Status: DC
Start: 2016-10-28 — End: 2017-03-31

## 2016-10-28 MED ORDER — LISINOPRIL 40 MG TAB
40 mg | ORAL_TABLET | ORAL | 1 refills | Status: DC
Start: 2016-10-28 — End: 2017-03-31

## 2016-10-28 MED ORDER — PRAMIPEXOLE 0.25 MG TAB
0.25 mg | ORAL_TABLET | ORAL | 1 refills | Status: DC
Start: 2016-10-28 — End: 2017-03-31

## 2016-10-28 MED ORDER — HYDROCHLOROTHIAZIDE 25 MG TAB
25 mg | ORAL_TABLET | ORAL | 1 refills | Status: DC
Start: 2016-10-28 — End: 2017-03-31

## 2016-11-08 ENCOUNTER — Encounter: Attending: Family Medicine

## 2016-11-22 ENCOUNTER — Ambulatory Visit: Admit: 2016-11-22 | Discharge: 2016-11-22 | Payer: PRIVATE HEALTH INSURANCE | Attending: Family Medicine

## 2016-11-22 DIAGNOSIS — M797 Fibromyalgia: Secondary | ICD-10-CM

## 2016-11-22 MED ORDER — ATORVASTATIN 40 MG TAB
40 mg | ORAL_TABLET | Freq: Every day | ORAL | 1 refills | Status: DC
Start: 2016-11-22 — End: 2017-05-29

## 2016-11-22 MED ORDER — DULOXETINE 30 MG CAP, DELAYED RELEASE
30 mg | ORAL_CAPSULE | Freq: Every day | ORAL | 1 refills | Status: DC
Start: 2016-11-22 — End: 2017-03-28

## 2016-11-22 MED ORDER — COENZYME Q10 100 MG CAP
100 mg | ORAL_CAPSULE | Freq: Every day | ORAL | 1 refills | Status: DC
Start: 2016-11-22 — End: 2017-09-04

## 2016-11-22 MED ORDER — DULOXETINE 30 MG CAP, DELAYED RELEASE
30 mg | ORAL_CAPSULE | Freq: Every day | ORAL | 0 refills | Status: DC
Start: 2016-11-22 — End: 2016-11-22

## 2016-11-22 NOTE — Patient Instructions (Addendum)
Learning About Statins for People With Diabetes  Introduction  Statins are medicines that help with your cholesterol. Cholesterol is a type of fat in your blood. If you have too much of this fat, it can build up in blood vessels. This raises your risk of heart disease, heart attack, and stroke.  Many people with diabetes take statins. Diabetes can cause problems in your body that may also lead to heart disease. That means your risks of heart attack and stroke are higher when you have diabetes. Statins can lower your risk.  Your doctor may prescribe a statin if:  ?? You have diabetes.  ?? AND you are age 40 to 75.  Statins may help people with diabetes at other ages too. Your doctor can help you decide if statins may help you.  Examples  Common statins include:  ?? Atorvastatin (Lipitor).  ?? Pravastatin (Pravachol).  ?? Rosuvastatin (Crestor).  ?? Simvastatin (Zocor).  Possible side effects  Common side effects include:  ?? Muscle aches. In most cases, the aches are not severe.  ?? Feeling tired.  You may have other side effects not listed here. Check the information that comes with your medicine.  What to know about taking this medicine  ?? You must take statins on a regular basis for them to work well. If you stop, your risk for heart attack and stroke may go back up.  ?? Be safe with medicines. Take your medicines exactly as prescribed. Call your doctor if you think you are having a problem with your medicine.  ?? If you have side effects that bother you, talk to your doctor. You may be able to take a different statin.  ?? Check with your doctor or pharmacist before you use any other medicines. This includes over-the-counter medicines. Make sure your doctor knows all of the medicines, vitamins, herbal products, and supplements you take. Taking some medicines together can cause problems.  Where can you learn more?  Go to http://www.healthwise.net/GoodHelpConnections.   Enter I337 in the search box to learn more about "Learning About Statins for People With Diabetes."  Current as of: November 27, 2015  Content Version: 11.4  ?? 2006-2017 [employer]wise, Inc[employer]. Care instructions adapted under license by [employer] (which disclaims liability or warranty for this information). If you have questions about a medical condition or this instruction, always ask your healthcare professional. Healthwise, Incorporated disclaims any warranty or liability for your use of this information.

## 2016-11-22 NOTE — Progress Notes (Signed)
Angela Elliott  61 y.o. female  05-25-1956  [address]  [place] [address]  [id number]     [facility]: Progress Note       Encounter Date: 11/22/2016    Chief Complaint   Patient presents with   ??? Medication Refill       History provided by patient  History of Present Illness   Angela Elliott is a 61 y.o. female who presents to clinic today for:    Fibromyalgia.   Patient present with cc of fibromyalgia. She reports that she is doing well with cymbalta at the current dose. She had been off of the medication for several month prior.     Hyperlipidemia:  Uncontrolled. Patient is concerned about starting statin. Brother had severe muscular atrophy after statin as well as  her mother. She expressed confusion regarding elevated cholesterol as she has stopped eating red meat. Reports that on eating red meat she immediately feels nauseous and vomits.   Cardiovascular risks for her are: diabetic  hyperlipidemia.   Currently she takes none   Myalgias: No  Cholesterol, total   Date Value Ref Range Status   10/18/2016 212 (H) 100 - 199 mg/dL Final   03/07/2016 193 100 - 199 mg/dL Final   10/02/2015 236 (H) 100 - 199 mg/dL Final     HDL Cholesterol   Date Value Ref Range Status   10/18/2016 57 >39 mg/dL Final   03/07/2016 57 >39 mg/dL Final   10/02/2015 59 >39 mg/dL Final     Triglyceride   Date Value Ref Range Status   10/18/2016 268 (H) 0 - 149 mg/dL Final   03/07/2016 255 (H) 0 - 149 mg/dL Final   10/02/2015 185 (H) 0 - 149 mg/dL Final     ALT (SGPT)   Date Value Ref Range Status   10/18/2016 27 0 - 32 IU/L Final     AST (SGOT)   Date Value Ref Range Status   10/18/2016 17 0 - 40 IU/L Final     Alk. phosphatase   Date Value Ref Range Status   10/18/2016 52 39 - 117 IU/L Final       Wt Readings from Last 3 Encounters:   11/22/16 229 lb (103.9 kg)   10/18/16 222 lb 6.4 oz (100.9 kg)   08/26/16 219 lb (99.3 kg)       Review of Systems   Review of Systems   Constitutional: Negative for chills and fever.    Respiratory: Negative for cough and wheezing.    Cardiovascular: Negative for chest pain.   Gastrointestinal: Negative for abdominal pain, constipation, diarrhea, nausea and vomiting.   Skin: Negative for itching and rash.   Neurological: Negative for dizziness and headaches.       Vitals/Objective:     Vitals:    11/22/16 1506   BP: 121/71   Pulse: (!) 112   Resp: 20   Temp: 98.1 ??F (36.7 ??C)   TempSrc: Oral   SpO2: 97%   Weight: 229 lb (103.9 kg)   Height: 5' 6"  (1.676 m)     Body mass index is 36.96 kg/(m^2).    Wt Readings from Last 3 Encounters:   11/22/16 229 lb (103.9 kg)   10/18/16 222 lb 6.4 oz (100.9 kg)   08/26/16 219 lb (99.3 kg)       Physical Exam   Constitutional: She is oriented to person, place, and time. She appears well-developed and well-nourished.  HENT:   Head: Normocephalic and atraumatic.   Cardiovascular: Normal rate and regular rhythm.    Pulmonary/Chest: Effort normal and breath sounds normal.   Musculoskeletal: Normal range of motion. She exhibits no edema.   Neurological: She is alert and oriented to person, place, and time.       No results found for this or any previous visit (from the past 24 hour(s)).  Assessment and Plan:     Encounter Diagnoses     ICD-10-CM ICD-9-CM   1. Fibromyalgia M79.7 729.1   2. Controlled type 2 diabetes mellitus without complication, without long-term current use of insulin (HCC) E11.9 250.00   3. Mixed hyperlipidemia E78.2 272.2   4. Allergy to meat Z91.018 V15.05   5. Screening for malignant neoplasm of breast Z12.31 V76.10       1. Fibromyalgia  Continue cymbalta for fibro.   - DULoxetine (CYMBALTA) 30 mg capsule; Take 1 Cap by mouth daily.  Dispense: 90 Cap; Refill: 1    2. Controlled type 2 diabetes mellitus without complication, without long-term current use of insulin ([place])  Start statin    3. Mixed hyperlipidemia  - atorvastatin (LIPITOR) 40 mg tablet; Take 1 Tab by mouth daily.  Dispense: 30 Tab; Refill: 1   - co-enzyme Q-10 (COQ-10) 100 mg capsule; Take 1 Cap by mouth daily.  Dispense: 30 Cap; Refill: 1    4. Allergy to meat  Check for alpha gal  - ALPHA-GAL FOOD PANEL    5. Screening for malignant neoplasm of breast  - MAM MAMMO BI SCREENING INCL CAD; Future      I have discussed the diagnosis with the patient and the intended plan as seen in the above orders.  she has expressed understanding.  The patient has received an after-visit summary and questions were answered concerning future plans.  I have discussed medication side effects and warnings with the patient as well.    Electronically Signed: Seth Bake, MD     History/Allergies   Patients past medical, surgical and family histories were reviewed and updated.    Past Medical History:   Diagnosis Date   ??? Anxiety    ??? Diabetes ([place])    ??? Hypertension    ??? Lupus    ??? Restless leg syndrome       Past Surgical History:   Procedure Laterality Date   ??? HX BREAST BIOPSY Left     2011-b9   ??? HX CESAREAN SECTION  1989/1991   ??? HX DILATION AND CURETTAGE  2013   ??? HX HEENT       Family History   Problem Relation Age of Onset   ??? Hypertension Mother    ??? Hypertension Father      Social History     Social History   ??? Marital status: MARRIED     Spouse name: Angela Elliott   ??? Number of children: Angela Elliott   ??? Years of education: Angela Elliott     Occupational History   ??? Not on file.     Social History Main Topics   ??? Smoking status: Never Smoker   ??? Smokeless tobacco: Never Used   ??? Alcohol use 0.0 oz/week     0 Glasses of wine per week   ??? Drug use: No   ??? Sexual activity: Not on file     Other Topics Concern   ??? Not on file     Social History Narrative  Allergies   Allergen Reactions   ??? Reglan [Metoclopramide] Other (comments)     Other reaction(s): Muscle ache   Spontaneous arm movements   ??? Diclofenac Other (comments)     Elevated liver enzymes.       Disposition     Follow-up Disposition:  Return in about 4 weeks (around 12/20/2016) for F/u with Dr. Jerilee Hoh. .     Future Appointments  Date Time Provider [place]   12/20/2016 8:00 AM Kristine Royal, MD [facility] SCHED            Current Medications after this visit     Current Outpatient Prescriptions   Medication Sig   ??? DULoxetine (CYMBALTA) 30 mg capsule Take 1 Cap by mouth daily.   ??? atorvastatin (LIPITOR) 40 mg tablet Take 1 Tab by mouth daily.   ??? co-enzyme Q-10 (COQ-10) 100 mg capsule Take 1 Cap by mouth daily.   ??? lisinopril (PRINIVIL, ZESTRIL) 40 mg tablet TAKE 1 TABLET DAILY   ??? hydroCHLOROthiazide (HYDRODIURIL) 25 mg tablet TAKE 1 TABLET DAILY   ??? pramipexole (MIRAPEX) 0.25 mg tablet TAKE 1 TABLET TWICE A DAY   ??? metFORMIN ER (GLUCOPHAGE XR) 500 mg tablet TAKE 2 TABLETS TWICE A DAY   ??? hydroxychloroquine (PLAQUENIL) 200 mg tablet Take 1 Tab by mouth two (2) times a day.   ??? loratadine (CLARITIN) 10 mg tablet Take 10 mg by mouth.   ??? mometasone (NASONEX) 50 mcg/actuation nasal spray 2 Sprays daily.   ??? fluticasone (FLONASE) 50 mcg/actuation nasal spray 2 Sprays by Both Nostrils route daily.   ??? Wheat Dextrin (BENEFIBER CLEAR) 3 gram/3.5 gram pwpk Use as directed in the container.     No current facility-administered medications for this visit.      Medications Discontinued During This Encounter   Medication Reason   ??? DULoxetine (CYMBALTA) 30 mg capsule Reorder   ??? DULoxetine (CYMBALTA) 30 mg capsule Reorder

## 2016-11-22 NOTE — Progress Notes (Signed)
1. Have you been to the ER, urgent care clinic since your last visit?  Hospitalized since your last visit?No    2. Have you seen or consulted any other health care providers outside of the [facility] since your last visit?  Include any pap smears or colon screening. No  Reviewed record in preparation for visit and have necessary documentation  Pt did not bring medication to office visit for review  Information was given to pt on Advanced Directives, Living Will  opportunity was given for questions  Goals that were addressed and/or need to be completed during or after this appointment include   Health Maintenance Due   Topic Date Due   ??? Pneumococcal 19-64 Medium Risk (1 of 1 - PPSV23) 11/16/1976   ??? DTaP/Tdap/Td series (1 - Tdap) 11/17/1978   ??? FOBT Q 1 YEAR AGE 86-75  11/17/2007   ??? Influenza Age 349 to Adult  04/16/2016   ??? FOOT EXAM Q1  09/26/2016     - check for functional glucose monitor and record keeping system- no not testing at home on a regular basis  Pt was given BS record log to document home readings and return to office for review  Diabetic Bundle:  LDL-101  A1C-6.3  BP-121/71  Smoking?no  Anticoagulation medication? no  Eye exam dilated?complete  Foot exam?complete

## 2016-11-26 MED ORDER — HYDROXYCHLOROQUINE 200 MG TAB
200 mg | ORAL_TABLET | ORAL | 1 refills | Status: DC
Start: 2016-11-26 — End: 2017-03-31

## 2016-11-27 LAB — ALPHA-GAL IGE PANEL
ALPHA GAL IGE*: <0.1 kU/L (ref <0.35)
BEEF (BOS SPP) IGE: <0.1 kU/L (ref <0.35)
CLASS INTERPRETATION: 0
CLASS INTERPRETATION: 0
CLASS INTERPRETATION: 0
LAMB/MUTTON (OVIS SPP) IGE: <0.1 kU/L (ref <0.35)
PORK (SUS SPP) IGE: <0.1 kU/L (ref <0.35)

## 2016-12-09 NOTE — Telephone Encounter (Signed)
Advise patient that she can try taking pill every other day to decreased side effects and she can discuss with provider at her next appt.     Bonne DoloresKimberly A Meagen Limones, MD

## 2016-12-09 NOTE — Telephone Encounter (Signed)
-----   Message from Ashok Palliffany N Robinson sent at 12/09/2016  3:25 PM EDT -----  Regarding: Dr. Lindie SpruceHlavac/Telephone  Pt is calling to speak with nurse regarding statin medication making her nauseated so she has stopped taking it. She is using [facility] at [phone]. Pt's best contact [phone].

## 2016-12-10 NOTE — Telephone Encounter (Signed)
Attempted to call. No answer. Message left.    See message from Dr. Hlavac

## 2016-12-11 ENCOUNTER — Encounter

## 2016-12-20 ENCOUNTER — Encounter: Attending: Family Medicine

## 2017-01-07 ENCOUNTER — Inpatient Hospital Stay: Admit: 2017-01-07 | Payer: BLUE CROSS/BLUE SHIELD | Attending: Family Medicine

## 2017-01-07 DIAGNOSIS — Z1231 Encounter for screening mammogram for malignant neoplasm of breast: Secondary | ICD-10-CM

## 2017-03-28 ENCOUNTER — Encounter

## 2017-03-28 MED ORDER — DULOXETINE 30 MG CAP, DELAYED RELEASE
30 mg | ORAL_CAPSULE | Freq: Every day | ORAL | 0 refills | Status: DC
Start: 2017-03-28 — End: 2017-03-31

## 2017-03-28 NOTE — Telephone Encounter (Signed)
appointment scheduled for 03-31-18  Please refill for one week per pt's request

## 2017-03-28 NOTE — Telephone Encounter (Signed)
Patient had been given a 90 day supply with a refill in March. For more refills she will need an appointment.     Bonne DoloresKimberly A Royelle Hinchman, MD

## 2017-03-28 NOTE — Addendum Note (Signed)
Addended byJimmey Ralph: Lizet Kelso W on: 03/28/2017 04:42 PM      Modules accepted: Orders

## 2017-03-28 NOTE — Telephone Encounter (Signed)
Attempted to call. Unsuccessful. Message left  Need to inform pt that she will be required to have an appt prior to further refills per Dr Dorita FrayHlavac

## 2017-03-28 NOTE — Telephone Encounter (Signed)
[facility] for Duloxetine. Pt has been advised on 48 hour turnaround time. Pt would like the rx filled today and pay oop until insurance info comes in. Best contact number: [phone]     Message taken by Call Center.  Last office visit was 11/22/16

## 2017-03-28 NOTE — Telephone Encounter (Signed)
Attempted to call. Unsuccessful. Line busy  Need to inform pt that she will be required to have an appt prior to further refills

## 2017-03-28 NOTE — Telephone Encounter (Signed)
Attempted to call. Unsuccessful. Line busy  Need to inform pt that she will be required to have an appt prior to further refills

## 2017-03-31 ENCOUNTER — Ambulatory Visit: Admit: 2017-03-31 | Discharge: 2017-03-31 | Payer: PRIVATE HEALTH INSURANCE | Attending: Family Medicine

## 2017-03-31 DIAGNOSIS — E119 Type 2 diabetes mellitus without complications: Secondary | ICD-10-CM

## 2017-03-31 MED ORDER — CIPROFLOXACIN-DEXAMETHASONE 0.3 %-0.1 % EAR DROPS, SUSP
Freq: Two times a day (BID) | OTIC | 0 refills | Status: AC
Start: 2017-03-31 — End: 2017-04-10

## 2017-03-31 MED ORDER — METFORMIN SR 500 MG 24 HR TABLET
500 mg | ORAL_TABLET | Freq: Two times a day (BID) | ORAL | 5 refills | Status: DC
Start: 2017-03-31 — End: 2017-04-26

## 2017-03-31 MED ORDER — PRAMIPEXOLE 0.25 MG TAB
0.25 mg | ORAL_TABLET | Freq: Every evening | ORAL | 5 refills | Status: DC
Start: 2017-03-31 — End: 2017-04-26

## 2017-03-31 MED ORDER — LISINOPRIL 40 MG TAB
40 mg | ORAL_TABLET | Freq: Every day | ORAL | 5 refills | Status: DC
Start: 2017-03-31 — End: 2017-04-26

## 2017-03-31 MED ORDER — HYDROCHLOROTHIAZIDE 25 MG TAB
25 mg | ORAL_TABLET | Freq: Every day | ORAL | 5 refills | Status: DC
Start: 2017-03-31 — End: 2017-04-26

## 2017-03-31 MED ORDER — HYDROXYCHLOROQUINE 200 MG TAB
200 mg | ORAL_TABLET | Freq: Two times a day (BID) | ORAL | 5 refills | Status: DC
Start: 2017-03-31 — End: 2017-05-25

## 2017-03-31 MED ORDER — DULOXETINE 30 MG CAP, DELAYED RELEASE
30 mg | ORAL_CAPSULE | Freq: Every day | ORAL | 5 refills | Status: DC
Start: 2017-03-31 — End: 2017-09-01

## 2017-03-31 NOTE — Progress Notes (Signed)
Reviewed record in preparation for visit and have necessary documentation  Pt did not bring medication to office visit for review    Goals that were addressed and/or need to be completed during or after this appointment include   Health Maintenance Due   Topic Date Due   ??? Pneumococcal 19-64 Medium Risk (1 of 1 - PPSV23) 11/16/1976   ??? DTaP/Tdap/Td series (1 - Tdap) 11/17/1978   ??? FOBT Q 1 YEAR AGE 59-75  11/17/2007   ??? FOOT EXAM Q1  09/26/2016   ??? EYE EXAM RETINAL OR DILATED Q1  02/06/2017   ??? MICROALBUMIN Q1  03/07/2017   ??? HEMOGLOBIN A1C Q6M  04/17/2017

## 2017-03-31 NOTE — Progress Notes (Signed)
Angela Elliott  61 y.o. female  1956-05-01  [address]  [place] [address]  [id number]     [facility]: Progress Note       Encounter Date: 03/31/2017    Chief Complaint   Patient presents with   ??? Medication Refill       History provided by patient  History of Present Illness   Angela Elliott is a 61 y.o. female who presents to clinic today for:    Fibromyaglia  Patient present with cc of fibromyalgia. She has been on duloxetine with good response to medication. She is not having pain; does report that when she misses doses she feels very stiff.     Lupus  Patient reports that had been followed by a rheumatologist for lupus in the past. She had requested to follow with this clinic at her last vosiot. No records from rheum on file not does patient recall the name of the doctor. Her last eye appointment was 1 year ago and she is scheduled for repeated exam 8/17 with [facility].     Hypertension: Uncontrolled   BP Readings from Last 3 Encounters:   03/31/17 140/73   11/22/16 121/71   10/18/16 145/83     The patient reports:  taking medications as instructed, no medication side effects noted, no TIA's, no chest pain on exertion, no dyspnea on exertion, no swelling of ankles.     Sodium   Date Value Ref Range Status   10/18/2016 144 134 - 144 mmol/L Final     Potassium   Date Value Ref Range Status   10/18/2016 4.2 3.5 - 5.2 mmol/L Final     Chloride   Date Value Ref Range Status   10/18/2016 102 96 - 106 mmol/L Final     Creatinine   Date Value Ref Range Status   10/18/2016 0.61 0.57 - 1.00 mg/dL Final   03/07/2016 0.64 0.57 - 1.00 mg/dL Final   10/02/2015 0.57 0.57 - 1.00 mg/dL Final     GFR est AA   Date Value Ref Range Status   10/18/2016 116 >59 mL/min/1.73 Final   03/07/2016 114 >59 mL/min/1.73 Final   10/02/2015 119 >59 mL/min/1.73 Final     GFR est non-AA   Date Value Ref Range Status   10/18/2016 100 >59 mL/min/1.73 Final   03/07/2016 99 >59 mL/min/1.73 Final    10/02/2015 103 >59 mL/min/1.73 Final       Our goal is to normalize the blood pressure to decrease the risks of strokes and heart attacks. The patient is in agreement with the plan.      Hyperlipidemia:  Uncontrolled  Cardiovascular risks for her are: diabetic  hypertension  hyperlipidemia  obese  sedentary lifestyle.   Currently she takes none. Had been prescribed Lipitor (atorvastatin)but stopped due to nausea.   Myalgias: No  Cholesterol, total   Date Value Ref Range Status   10/18/2016 212 (H) 100 - 199 mg/dL Final   03/07/2016 193 100 - 199 mg/dL Final   10/02/2015 236 (H) 100 - 199 mg/dL Final     HDL Cholesterol   Date Value Ref Range Status   10/18/2016 57 >39 mg/dL Final   03/07/2016 57 >39 mg/dL Final   10/02/2015 59 >39 mg/dL Final     Triglyceride   Date Value Ref Range Status   10/18/2016 268 (H) 0 - 149 mg/dL Final   03/07/2016 255 (H) 0 - 149 mg/dL Final   10/02/2015 185 (H)  0 - 149 mg/dL Final     ALT (SGPT)   Date Value Ref Range Status   10/18/2016 27 0 - 32 IU/L Final     AST (SGOT)   Date Value Ref Range Status   10/18/2016 17 0 - 40 IU/L Final     Alk. phosphatase   Date Value Ref Range Status   10/18/2016 52 39 - 117 IU/L Final       Wt Readings from Last 3 Encounters:   03/31/17 230 lb 3.2 oz (104.4 kg)   11/22/16 229 lb (103.9 kg)   10/18/16 222 lb 6.4 oz (100.9 kg)         Swelling of left ear canal  Patient reports that she has had swelling of the left ear canal for 3 days. Endorses several episodes in the past. Denies fever, chills.    Health Maintenance  Discussed and ordered. Release signed.     Health Maintenance Due   Topic Date Due   ??? Pneumococcal 19-64 Medium Risk (1 of 1 - PPSV23) 11/16/1976   ??? DTaP/Tdap/Td series (1 - Tdap) 11/17/1978   ??? FOBT Q 1 YEAR AGE 48-75  11/17/2007   ??? FOOT EXAM Q1  09/26/2016   ??? EYE EXAM RETINAL OR DILATED Q1  02/06/2017   ??? MICROALBUMIN Q1  03/07/2017   ??? HEMOGLOBIN A1C Q6M  04/17/2017     Review of Systems   Review of Systems    Constitutional: Negative for chills and fever.   HENT: Positive for ear discharge and ear pain. Negative for congestion.    Respiratory: Negative for cough.    Cardiovascular: Negative for chest pain, palpitations and leg swelling.   Gastrointestinal: Negative for abdominal pain, blood in stool, constipation, diarrhea, melena, nausea and vomiting.   Musculoskeletal: Negative for back pain, joint pain and myalgias.   Skin: Negative for itching and rash.   Neurological: Negative for dizziness, speech change, focal weakness and headaches.       Vitals/Objective:     Vitals:    03/31/17 0850 03/31/17 0919   BP: 154/76 140/73   Pulse: 90    Resp: 20    Temp: 98.1 ??F (36.7 ??C)    TempSrc: Oral    SpO2: 94%    Weight: 230 lb 3.2 oz (104.4 kg)    Height: 5' 6"  (1.676 m)      Body mass index is 37.16 kg/(m^2).    Wt Readings from Last 3 Encounters:   03/31/17 230 lb 3.2 oz (104.4 kg)   11/22/16 229 lb (103.9 kg)   10/18/16 222 lb 6.4 oz (100.9 kg)       Physical Exam   Constitutional: She appears well-developed and well-nourished.   HENT:   Head: Normocephalic and atraumatic.   Right Ear: Tympanic membrane, external ear and ear canal normal. No drainage. No decreased hearing is noted.   Left Ear: Hearing, tympanic membrane and external ear normal. No lacerations. There is drainage, swelling and tenderness. No foreign bodies.  No middle ear effusion.   Mouth/Throat: Oropharynx is clear and moist.   Eyes: EOM are normal. Pupils are equal, round, and reactive to light.   Neck: Normal range of motion. Neck supple.   Cardiovascular: Normal rate and regular rhythm.    No murmur heard.  Pulmonary/Chest: Effort normal and breath sounds normal.   Lymphadenopathy:        Head (right side): No submandibular, no tonsillar, no preauricular, no posterior auricular and no occipital adenopathy  present.        Head (left side): Preauricular adenopathy present. No submandibular,  no tonsillar, no posterior auricular and no occipital adenopathy present.     She has no cervical adenopathy.        No results found for this or any previous visit (from the past 24 hour(s)).  Assessment and Plan:     Encounter Diagnoses     ICD-10-CM ICD-9-CM   1. Controlled type 2 diabetes mellitus without complication, without long-term current use of insulin (HCC) E11.9 250.00   2. Fibromyalgia M79.7 729.1   3. Mixed hyperlipidemia E78.2 272.2   4. Essential hypertension I10 401.9   5. Lupus erythematosus, unspecified form L93.0 695.4   6. Restless legs syndrome (RLS) G25.81 333.94   7. Other infective acute otitis externa of left ear H60.392 380.10       1. Fibromyalgia  Refill cymbalta.   - DULoxetine (CYMBALTA) 30 mg capsule; Take 1 Cap by mouth daily. Indications: FIBROMYALGIA  Dispense: 30 Cap; Refill: 5    2. Controlled type 2 diabetes mellitus without complication, without long-term current use of insulin (HCC)  Recheck hemoglobin A1c for compliance monitoring.   - metFORMIN ER (GLUCOPHAGE XR) 500 mg tablet; Take 2 Tabs by mouth two (2) times daily (with meals).  Dispense: 120 Tab; Refill: 5  - MICROALBUMIN, UR, RAND W/ MICROALB/CREAT RATIO  - HEMOGLOBIN A1C WITH EAG    3. Mixed hyperlipidemia  Patient is willing to resume lipitor as her nausea has resolved and she is now thinking that it may have been due to acute illness and not the medication.  - LIPID PANEL    4. Essential hypertension  repeat BP nearer goal.   - hydroCHLOROthiazide (HYDRODIURIL) 25 mg tablet; Take 1 Tab by mouth daily.  Dispense: 30 Tab; Refill: 5  - lisinopril (PRINIVIL, ZESTRIL) 40 mg tablet; Take 1 Tab by mouth daily.  Dispense: 30 Tab; Refill: 5  - METABOLIC PANEL, COMPREHENSIVE    5. Lupus erythematosus, unspecified form  Patient signed request for records from Rheum. Will also request Eye exams.  - hydroxychloroquine (PLAQUENIL) 200 mg tablet; Take 1 Tab by mouth two (2) times a day.  Dispense: 60 Tab; Refill: 5     6. Restless legs syndrome (RLS)  - pramipexole (MIRAPEX) 0.25 mg tablet; Take 1 Tab by mouth nightly.  Dispense: 60 Tab; Refill: 5    7. Other infective acute otitis externa of left ear  - ciprofloxacin-dexamethasone (CIPRODEX) 0.3-0.1 % otic suspension; Administer 4 Drops in left ear two (2) times a day for 10 days.  Dispense: 7.5 mL; Refill: 0      I have discussed the diagnosis with the patient and the intended plan as seen in the above orders.  she has expressed understanding.  The patient has received an after-visit summary and questions were answered concerning future plans.  I have discussed medication side effects and warnings with the patient as well.    Electronically Signed: Seth Bake, MD     History/Allergies   Patients past medical, surgical and family histories were reviewed and updated.    Past Medical History:   Diagnosis Date   ??? Anxiety    ??? Diabetes ([place])    ??? Hypertension    ??? Lupus    ??? Restless leg syndrome       Past Surgical History:   Procedure Laterality Date   ??? HX BREAST BIOPSY Left     2011-b9   ???  HX CESAREAN SECTION  1989/1991   ??? HX DILATION AND CURETTAGE  2013   ??? HX HEENT       Family History   Problem Relation Age of Onset   ??? Hypertension Mother    ??? Hypertension Father      Social History     Social History   ??? Marital status: MARRIED     Spouse name: N/A   ??? Number of children: N/A   ??? Years of education: N/A     Occupational History   ??? Not on file.     Social History Main Topics   ??? Smoking status: Never Smoker   ??? Smokeless tobacco: Never Used   ??? Alcohol use 0.0 oz/week     0 Glasses of wine per week   ??? Drug use: No   ??? Sexual activity: Not on file     Other Topics Concern   ??? Not on file     Social History Narrative         Allergies   Allergen Reactions   ??? Reglan [Metoclopramide] Other (comments)     Other reaction(s): Muscle ache   Spontaneous arm movements   ??? Diclofenac Other (comments)     Elevated liver enzymes.       Disposition     Follow-up Disposition:   Return in about 3 months (around 07/01/2017) for Routine.    No future appointments.         Current Medications after this visit     Current Outpatient Prescriptions   Medication Sig   ??? DULoxetine (CYMBALTA) 30 mg capsule Take 1 Cap by mouth daily. Indications: FIBROMYALGIA   ??? hydroCHLOROthiazide (HYDRODIURIL) 25 mg tablet Take 1 Tab by mouth daily.   ??? lisinopril (PRINIVIL, ZESTRIL) 40 mg tablet Take 1 Tab by mouth daily.   ??? pramipexole (MIRAPEX) 0.25 mg tablet Take 1 Tab by mouth nightly.   ??? metFORMIN ER (GLUCOPHAGE XR) 500 mg tablet Take 2 Tabs by mouth two (2) times daily (with meals).   ??? hydroxychloroquine (PLAQUENIL) 200 mg tablet Take 1 Tab by mouth two (2) times a day.   ??? ciprofloxacin-dexamethasone (CIPRODEX) 0.3-0.1 % otic suspension Administer 4 Drops in left ear two (2) times a day for 10 days.   ??? atorvastatin (LIPITOR) 40 mg tablet Take 1 Tab by mouth daily.   ??? co-enzyme Q-10 (COQ-10) 100 mg capsule Take 1 Cap by mouth daily.   ??? Wheat Dextrin (BENEFIBER CLEAR) 3 gram/3.5 gram pwpk Use as directed in the container.   ??? loratadine (CLARITIN) 10 mg tablet Take 10 mg by mouth.   ??? mometasone (NASONEX) 50 mcg/actuation nasal spray 2 Sprays daily.   ??? fluticasone (FLONASE) 50 mcg/actuation nasal spray 2 Sprays by Both Nostrils route daily.     No current facility-administered medications for this visit.      Medications Discontinued During This Encounter   Medication Reason   ??? DULoxetine (CYMBALTA) 30 mg capsule Reorder   ??? hydroCHLOROthiazide (HYDRODIURIL) 25 mg tablet Reorder   ??? lisinopril (PRINIVIL, ZESTRIL) 40 mg tablet Reorder   ??? pramipexole (MIRAPEX) 0.25 mg tablet Reorder   ??? metFORMIN ER (GLUCOPHAGE XR) 500 mg tablet Reorder   ??? hydroxychloroquine (PLAQUENIL) 200 mg tablet Reorder

## 2017-03-31 NOTE — Patient Instructions (Addendum)
DASH Diet: Care Instructions  Your Care Instructions    The DASH diet is an eating plan that can help lower your blood pressure. DASH stands for Dietary Approaches to Stop Hypertension. Hypertension is high blood pressure.  The DASH diet focuses on eating foods that are high in calcium, potassium, and magnesium. These nutrients can lower blood pressure. The foods that are highest in these nutrients are fruits, vegetables, low-fat dairy products, nuts, seeds, and legumes. But taking calcium, potassium, and magnesium supplements instead of eating foods that are high in those nutrients does not have the same effect. The DASH diet also includes whole grains, fish, and poultry.  The DASH diet is one of several lifestyle changes your doctor may recommend to lower your high blood pressure. Your doctor may also want you to decrease the amount of sodium in your diet. Lowering sodium while following the DASH diet can lower blood pressure even further than just the DASH diet alone.  Follow-up care is a key part of your treatment and safety. Be sure to make and go to all appointments, and call your doctor if you are having problems. It's also a good idea to know your test results and keep a list of the medicines you take.  How can you care for yourself at home?  Following the DASH diet  ?? Eat 4 to 5 servings of fruit each day. A serving is 1 medium-sized piece of fruit, ?? cup chopped or canned fruit, 1/4 cup dried fruit, or 4 ounces (?? cup) of fruit juice. Choose fruit more often than fruit juice.  ?? Eat 4 to 5 servings of vegetables each day. A serving is 1 cup of lettuce or raw leafy vegetables, ?? cup of chopped or cooked vegetables, or 4 ounces (?? cup) of vegetable juice. Choose vegetables more often than vegetable juice.  ?? Get 2 to 3 servings of low-fat and fat-free dairy each day. A serving is 8 ounces of milk, 1 cup of yogurt, or 1 ?? ounces of cheese.   ?? Eat 6 to 8 servings of grains each day. A serving is 1 slice of bread, 1 ounce of dry cereal, or ?? cup of cooked rice, pasta, or cooked cereal. Try to choose whole-grain products as much as possible.  ?? Limit lean meat, poultry, and fish to 2 servings each day. A serving is 3 ounces, about the size of a deck of cards.  ?? Eat 4 to 5 servings of nuts, seeds, and legumes (cooked dried beans, lentils, and split peas) each week. A serving is 1/3 cup of nuts, 2 tablespoons of seeds, or ?? cup of cooked beans or peas.  ?? Limit fats and oils to 2 to 3 servings each day. A serving is 1 teaspoon of vegetable oil or 2 tablespoons of salad dressing.  ?? Limit sweets and added sugars to 5 servings or less a week. A serving is 1 tablespoon jelly or jam, ?? cup sorbet, or 1 cup of lemonade.  ?? Eat less than 2,300 milligrams (mg) of sodium a day. If you limit your sodium to 1,500 mg a day, you can lower your blood pressure even more.  Tips for success  ?? Start small. Do not try to make dramatic changes to your diet all at once. You might feel that you are missing out on your favorite foods and then be more likely to not follow the plan. Make small changes, and stick with them. Once those changes become habit, add a   few more changes.  ?? Try some of the following:  ?? Make it a goal to eat a fruit or vegetable at every meal and at snacks. This will make it easy to get the recommended amount of fruits and vegetables each day.  ?? Try yogurt topped with fruit and nuts for a snack or healthy dessert.  ?? Add lettuce, tomato, cucumber, and onion to sandwiches.  ?? Combine a ready-made pizza crust with low-fat mozzarella cheese and lots of vegetable toppings. Try using tomatoes, squash, spinach, broccoli, carrots, cauliflower, and onions.  ?? Have a variety of cut-up vegetables with a low-fat dip as an appetizer instead of chips and dip.  ?? Sprinkle sunflower seeds or chopped almonds over salads. Or try adding  chopped walnuts or almonds to cooked vegetables.  ?? Try some vegetarian meals using beans and peas. Add garbanzo or kidney beans to salads. Make burritos and tacos with mashed pinto beans or black beans.  Where can you learn more?  Go to http://www.healthwise.net/[employer].  Enter H967 in the search box to learn more about "DASH Diet: Care Instructions."  Current as of: August 21, 2016  Content Version: 11.7  ?? 2006-2018 [employer]. Care instructions adapted under license by [employer] (which disclaims liability or warranty for this information). If you have questions about a medical condition or this instruction, always ask your healthcare professional. [employer]wise[employer] Inc[employer] disclaims any warranty or liability for your use of this information.

## 2017-04-01 LAB — DIABETES PATIENT EDUCATION

## 2017-04-03 LAB — HEMOGLOBIN A1C WITH EAG
Estimated average glucose: 131 mg/dL
Hemoglobin A1c: 6.2 % — ABNORMAL HIGH (ref 4.8–5.6)

## 2017-04-03 LAB — METABOLIC PANEL, COMPREHENSIVE
A-G Ratio: 2.4 — ABNORMAL HIGH (ref 1.2–2.2)
ALT (SGPT): 24 IU/L (ref 0–32)
AST (SGOT): 19 IU/L (ref 0–40)
Albumin: 4.6 g/dL (ref 3.5–5.5)
Alk. phosphatase: 56 IU/L (ref 39–117)
BUN/Creatinine ratio: 25 — ABNORMAL HIGH (ref 9–23)
BUN: 14 mg/dL (ref 6–24)
Bilirubin, total: <0.2 mg/dL (ref 0.0–1.2)
CO2: 20 mmol/L (ref 20–29)
Calcium: 9.1 mg/dL (ref 8.7–10.2)
Chloride: 102 mmol/L (ref 96–106)
Creatinine: 0.55 mg/dL — ABNORMAL LOW (ref 0.57–1.00)
GFR est AA: 119 mL/min/{1.73_m2} (ref >59)
GFR est non-AA: 103 mL/min/{1.73_m2} (ref >59)
GLOBULIN, TOTAL: 1.9 g/dL (ref 1.5–4.5)
Glucose: 109 mg/dL — ABNORMAL HIGH (ref 65–99)
Potassium: 4.4 mmol/L (ref 3.5–5.2)
Protein, total: 6.5 g/dL (ref 6.0–8.5)
Sodium: 142 mmol/L (ref 134–144)

## 2017-04-03 LAB — LIPID PANEL
Cholesterol, total: 208 mg/dL — ABNORMAL HIGH (ref 100–199)
HDL Cholesterol: 67 mg/dL (ref >39)
LDL, calculated: 101 mg/dL — ABNORMAL HIGH (ref 0–99)
Triglyceride: 200 mg/dL — ABNORMAL HIGH (ref 0–149)
VLDL, calculated: 40 mg/dL (ref 5–40)

## 2017-04-03 LAB — MICROALBUMIN, UR, RAND W/ MICROALB/CREAT RATIO
Creatinine, urine random: 114.4 mg/dL
Microalb/Creat ratio (ug/mg creat.): 64.9 mg/g creat — ABNORMAL HIGH (ref 0.0–30.0)
Microalbumin, urine: 74.2 ug/mL

## 2017-04-08 NOTE — Telephone Encounter (Signed)
Pt is calling to see if she needs to do something about an odd lab results before she leaves town on Monday.

## 2017-04-08 NOTE — Telephone Encounter (Signed)
Attempted to call. No answer. Message left.    Will forward concern to MD regarding high lab value.  Message left for patient that MD is not here this afternoon.

## 2017-04-08 NOTE — Telephone Encounter (Signed)
Pt called requesting to speak with nurse. I advised her of message and letter. She states that she would still like to speak with nurse in regards to microalb/creat being so high

## 2017-04-08 NOTE — Telephone Encounter (Signed)
Attempted to call. No answer. Message left.    Recent lab letter  RECOMMENDATIONS:  Keep up the good work! Your cholesterol is improved

## 2017-04-09 NOTE — Telephone Encounter (Signed)
The ratio can be indicative of early kidney damage due to diabetes. However currently I am monitoring the level as the kidney can heal if the diabetes is well controlled, which her's is, and the healing can be monitored by watching the ratio as well.     Bonne DoloresKimberly A Leylanie Woodmansee, MD

## 2017-04-09 NOTE — Telephone Encounter (Signed)
Pt returned call. She was advised of Dr. Dorita FrayHlavac message. Pt verbalized understanding.

## 2017-04-26 ENCOUNTER — Encounter

## 2017-04-28 MED ORDER — PRAMIPEXOLE 0.25 MG TAB
0.25 mg | ORAL_TABLET | ORAL | 1 refills | Status: DC
Start: 2017-04-28 — End: 2017-10-25

## 2017-04-28 MED ORDER — METFORMIN SR 500 MG 24 HR TABLET
500 mg | ORAL_TABLET | ORAL | 1 refills | Status: DC
Start: 2017-04-28 — End: 2017-10-25

## 2017-04-28 MED ORDER — HYDROCHLOROTHIAZIDE 25 MG TAB
25 mg | ORAL_TABLET | ORAL | 1 refills | Status: DC
Start: 2017-04-28 — End: 2017-10-25

## 2017-04-28 MED ORDER — LISINOPRIL 40 MG TAB
40 mg | ORAL_TABLET | ORAL | 1 refills | Status: DC
Start: 2017-04-28 — End: 2017-10-25

## 2017-05-25 ENCOUNTER — Encounter

## 2017-05-26 MED ORDER — HYDROXYCHLOROQUINE 200 MG TAB
200 mg | ORAL_TABLET | ORAL | 1 refills | Status: DC
Start: 2017-05-26 — End: 2017-11-22

## 2017-05-29 ENCOUNTER — Ambulatory Visit: Admit: 2017-05-29 | Discharge: 2017-05-29 | Payer: PRIVATE HEALTH INSURANCE | Attending: Family Medicine

## 2017-05-29 DIAGNOSIS — R079 Chest pain, unspecified: Secondary | ICD-10-CM

## 2017-05-29 MED ORDER — PANTOPRAZOLE 40 MG TAB, DELAYED RELEASE
40 mg | ORAL_TABLET | Freq: Every day | ORAL | 0 refills | Status: DC
Start: 2017-05-29 — End: 2017-09-04

## 2017-05-29 MED ORDER — KETOPROFEN 10 % TOPICAL CREAM IN PACKET
10 % | PACK | Freq: Two times a day (BID) | CUTANEOUS | 3 refills | Status: DC
Start: 2017-05-29 — End: 2017-09-04

## 2017-05-29 NOTE — Telephone Encounter (Signed)
Dr. Esquivel spoke with [facility].

## 2017-05-29 NOTE — Telephone Encounter (Signed)
Angela Elliott called (Angela Elliott).  They cannot get ketoprofen 10 % crpk in.  What can she use in the place of this?  Please advise at [phone].

## 2017-05-29 NOTE — Patient Instructions (Addendum)
Chest Pain: Care Instructions  Your Care Instructions    There are many things that can cause chest pain. Some are not serious and will get better on their own in a few days. But some kinds of chest pain need more testing and treatment. Your doctor may have recommended a follow-up visit in the next 8 to 12 hours. If you are not getting better, you may need more tests or treatment.  Even though your doctor has released you, you still need to watch for any problems. The doctor carefully checked you, but sometimes problems can develop later. If you have new symptoms or if your symptoms do not get better, get medical care right away.  If you have worse or different chest pain or pressure that lasts more than 5 minutes or you passed out (lost consciousness), call 911 or seek other emergency help right away.   A medical visit is only one step in your treatment. Even if you feel better, you still need to do what your doctor recommends, such as going to all suggested follow-up appointments and taking medicines exactly as directed. This will help you recover and help prevent future problems.  How can you care for yourself at home?  ?? Rest until you feel better.  ?? Take your medicine exactly as prescribed. Call your doctor if you think you are having a problem with your medicine.  ?? Do not drive after taking a prescription pain medicine.  When should you call for help?  Call 911 if:  ?? ?? You passed out (lost consciousness).   ?? ?? You have severe difficulty breathing.   ?? ?? You have symptoms of a heart attack. These may include:  ?? Chest pain or pressure, or a strange feeling in your chest.  ?? Sweating.  ?? Shortness of breath.  ?? Nausea or vomiting.  ?? Pain, pressure, or a strange feeling in your back, neck, jaw, or upper belly or in one or both shoulders or arms.  ?? Lightheadedness or sudden weakness.  ?? A fast or irregular heartbeat.  After you call 911, the operator may tell you to chew 1 adult-strength or  2 to 4 low-dose aspirin. Wait for an ambulance. Do not try to drive yourself.   ??Call your doctor today if:  ?? ?? You have any trouble breathing.   ?? ?? Your chest pain gets worse.   ?? ?? You are dizzy or lightheaded, or you feel like you may faint.   ?? ?? You are not getting better as expected.   ?? ?? You are having new or different chest pain.   Where can you learn more?  Go to [employer]Stats[employer]cahttp://www.healthwise.net/GoodHelpConnections.  Enter A120 in the search box to learn more about "Chest Pain: Care Instructions."  Current as of: August 05, 2016  Content Version: 11.7  ?? 2006-2018 [employer]. Care instructions adapted under license by [employer] (which disclaims liability or warranty for this information). If you have questions about a medical condition or this instruction, always ask your healthcare professional. Healthwise, Incorporated disclaims any warranty or liability for your use of this information.       Gastroesophageal Reflux Disease (GERD): Care Instructions  Your Care Instructions    Gastroesophageal reflux disease (GERD) is the backward flow of stomach acid into the esophagus. The esophagus is the tube that leads from your throat to your stomach. A one-way valve prevents the stomach acid from moving up into this tube. When you have GERD,  this valve does not close tightly enough.  If you have mild GERD symptoms including heartburn, you may be able to control the problem with antacids or over-the-counter medicine. Changing your diet, losing weight, and making other lifestyle changes can also help reduce symptoms.  Follow-up care is a key part of your treatment and safety. Be sure to make and go to all appointments, and call your doctor if you are having problems. It's also a good idea to know your test results and keep a list of the medicines you take.  How can you care for yourself at home?  ?? Take your medicines exactly as prescribed. Call your doctor if you think  you are having a problem with your medicine.  ?? Your doctor may recommend over-the-counter medicine. For mild or occasional indigestion, antacids, such as Tums, Gaviscon, Mylanta, or Maalox, may help. Your doctor also may recommend over-the-counter acid reducers, such as Pepcid AC, Tagamet HB, Zantac 75, or Prilosec. Read and follow all instructions on the label. If you use these medicines often, talk with your doctor.  ?? Change your eating habits.  ?? It's best to eat several small meals instead of two or three large meals.  ?? After you eat, wait 2 to 3 hours before you lie down.  ?? Chocolate, mint, and alcohol can make GERD worse.  ?? Spicy foods, foods that have a lot of acid (like tomatoes and oranges), and coffee can make GERD symptoms worse in some people. If your symptoms are worse after you eat a certain food, you may want to stop eating that food to see if your symptoms get better.  ?? Do not smoke or chew tobacco. Smoking can make GERD worse. If you need help quitting, talk to your doctor about stop-smoking programs and medicines. These can increase your chances of quitting for good.  ?? If you have GERD symptoms at night, raise the head of your bed 6 to 8 inches by putting the frame on blocks or placing a foam wedge under the head of your mattress. (Adding extra pillows does not work.)  ?? Do not wear tight clothing around your middle.  ?? Lose weight if you need to. Losing just 5 to 10 pounds can help.  When should you call for help?  Call your doctor now or seek immediate medical care if:  ?? ?? You have new or different belly pain.   ?? ?? Your stools are black and tarlike or have streaks of blood.   ??Watch closely for changes in your health, and be sure to contact your doctor if:  ?? ?? Your symptoms have not improved after 2 days.   ?? ?? Food seems to catch in your throat or chest.   Where can you learn more?  Go to [employer].   Enter [id number] in the search box to learn more about "Gastroesophageal Reflux Disease (GERD): Care Instructions."  Current as of: Jan 26, 2016  Content Version: 11.7  ?? 2006-2018 [employer]. Care instructions adapted under license by [employer] (which disclaims liability or warranty for this information). If you have questions about a medical condition or this instruction, always ask your healthcare professional. [employer], [employer] disclaims any warranty or liability for your use of this information.

## 2017-05-29 NOTE — Progress Notes (Addendum)
CC: Epigastric pain    HPI: Pt is a 61 y.o. female who presents for epigastric pain. Two days ago she ate a slice of pizza for dinner and woke up at 2:30AM with heartburn. She took some TUMS and drank some soda with no improvement. Yesterday she had an acid-brash taste in her mouth all day. Last night she also woke up with bad epigastric pain and this morning woke up with a dull pain below her left shoulder blade and mild nausea. She has only had heartburn 5 times in her adult life and it felt similar to this but always resolved with TUMS. She denies any blood in stool or change in stool color.     Of note, pt's brother had an MI at 4, no other first degree relatives with CAD. Pt's only heart history is when she was in the hospital with sepsis and went into CHF (?cardiomyopathy) but was subsequently cleared by Cardiology and told her heart was normal again. Chart review shows she had a normal Lexiscan in 07/2016 and echo with normal EF and grade I diastolic dysfunction in 2016.     She does have Lupus and takes  Motrin in the AM and 600-800mg  at night in addition to her Plaquenil. She has been doing this NSAID regimen for about one year. Of note, she has an allergy to Diclofenac.       Past Medical History:   Diagnosis Date   ??? Anxiety    ??? Diabetes (HCC)    ??? Hypertension    ??? Lupus    ??? Restless leg syndrome        Family History   Problem Relation Age of Onset   ??? Hypertension Mother    ??? Hypertension Father        Social History   Substance Use Topics   ??? Smoking status: Never Smoker   ??? Smokeless tobacco: Never Used   ??? Alcohol use 0.0 oz/week     0 Glasses of wine per week       ROS:  Positive only when bolded  Constitutional: F/C, changes in weight  Respiratory: SOB, wheezing, cough  Cardiovascular: CP, palpitations   Gastrointestinal: Abd pain, blood in stool  Musculoskeletal: Myalgias, arthralgias    PE:  Visit Vitals   ??? BP 144/90 (BP 1 Location: Left arm, BP Patient Position: Sitting)   ??? Pulse 98    ??? Temp 98.4 ??F (36.9 ??C) (Oral)   ??? Resp 16   ??? Ht  (1.676 m)   ??? Wt 232 lb (105.2 kg)   ??? SpO2 94%   ??? BMI 37.45 kg/m2     Gen: Pt sitting in chair, in NAD  Head: Normocephalic, atraumatic  Eyes: Sclera anicteric, EOM grossly intact, PERRL  Throat: MMM  Neck: Supple, no carotid bruits  CVS: Normal S1, S2, no m/r/g  Resp: CTAB, no wheezes or rales  Abd: Soft, non-tender, non-distended, +BS  MSK: No TTP of posterior shoulder  Extrem: Atraumatic, no cyanosis or edema  Pulses: 2+    Skin: Warm, dry  Neuro: Alert, oriented, appropriate    POC EKG: I personally reviewed and interpreted this EKG. NSR, no acute changes.       A/P: Pt is a 61 y.o. female who presents for epigastric pain, likely GERD 2/2 NSAID use.EKG without acute changes and normal Lexiscan less than 1 year ago. Pt advised that Lupus can cause cardiac issues as well, although no evidence of pericarditis on exam.  Advised her to have low threshold to go to ER for any worsening of symptoms.     - Pt advised to stop all NSAIDs  - Trial of Protonix  - Attempted rx for topical ketoprofen but pt's pharmacy unable to get this. Will do trial of lidocaine patch in combination with Tylenol. Pt advised if this is not working we can send her back to Rheum for further management  - Strict ER precautions given for chest pain. If her pain worsens she will go to ER. If she is not feeling better in 1 week she will call or RTC and will discuss further management at that point depending on symptoms.   - Flu shot given today  - RTC in 1 week or PRN. Will need regular f/u with her PCP to recheck BP which is likely elevated today 2/2 pain.         Discussed diagnoses in detail with patient.   Medication risks/benefits/side effects discussed with patient.   All of the patient's questions were addressed. The patient understands and agrees with our plan of care.  The patient knows to call back if they are unsure of or forget any changes  we discussed today or if the symptoms change.  The patient received an After-Visit Summary which contains VS, orders, medication list and allergy list. This can be used as a "mini-medical record" should they have to seek medical care while out of town.    Current Outpatient Prescriptions on File Prior to Visit   Medication Sig Dispense Refill   ??? hydroxychloroquine (PLAQUENIL) 200 mg tablet TAKE 1 TABLET TWICE A DAY 180 Tab 1   ??? hydroCHLOROthiazide (HYDRODIURIL) 25 mg tablet TAKE 1 TABLET DAILY 90 Tab 1   ??? metFORMIN ER (GLUCOPHAGE XR) 500 mg tablet TAKE 2 TABLETS TWICE A DAY 360 Tab 1   ??? pramipexole (MIRAPEX) 0.25 mg tablet TAKE 1 TABLET TWICE A DAY 180 Tab 1   ??? lisinopril (PRINIVIL, ZESTRIL) 40 mg tablet TAKE 1 TABLET DAILY 90 Tab 1   ??? DULoxetine (CYMBALTA) 30 mg capsule Take 1 Cap by mouth daily. Indications: FIBROMYALGIA 30 Cap 5   ??? co-enzyme Q-10 (COQ-10) 100 mg capsule Take 1 Cap by mouth daily. 30 Cap 1   ??? atorvastatin (LIPITOR) 40 mg tablet Take 1 Tab by mouth daily. 30 Tab 1   ??? Wheat Dextrin (BENEFIBER CLEAR) 3 gram/3.5 gram pwpk Use as directed in the container. 30 Packet 5   ??? loratadine (CLARITIN) 10 mg tablet Take 10 mg by mouth.     ??? mometasone (NASONEX) 50 mcg/actuation nasal spray 2 Sprays daily.     ??? fluticasone (FLONASE) 50 mcg/actuation nasal spray 2 Sprays by Both Nostrils route daily. 1 Bottle 0     No current facility-administered medications on file prior to visit.

## 2017-05-29 NOTE — Progress Notes (Signed)
1. Have you been to the ER, urgent care clinic since your last visit?  Hospitalized since your last visit? no    2. Have you seen or consulted any other health care providers outside of the [facility] since your last visit?  Include any pap smears or colon screening. yes  Reviewed record in preparation for visit and have obtained necessary documentation.  Patient did not bring medications to visit for review.  Information provided on Advanced Directive, Living Will.  Body mass index is 37.45 kg/(m^2).   Health Maintenance Due   Topic Date Due   ??? Pneumococcal 19-64 Medium Risk (1 of 1 - PPSV23) 11/16/1976   ??? DTaP/Tdap/Td series (1 - Tdap) 11/17/1978   ??? FOBT Q 1 YEAR AGE 100-75  11/17/2007   ??? FOOT EXAM Q1  09/26/2016   ??? EYE EXAM RETINAL OR DILATED Q1  02/06/2017   ??? Influenza Age 87 to Adult  04/16/2017

## 2017-07-07 NOTE — Telephone Encounter (Signed)
Prior auth completed. Awaiting response from insurance company

## 2017-09-01 ENCOUNTER — Encounter

## 2017-09-01 MED ORDER — DULOXETINE 30 MG CAP, DELAYED RELEASE
30 mg | ORAL_CAPSULE | Freq: Every day | ORAL | 0 refills | Status: DC
Start: 2017-09-01 — End: 2017-10-30

## 2017-09-04 ENCOUNTER — Ambulatory Visit: Admit: 2017-09-04 | Discharge: 2017-09-04 | Payer: PRIVATE HEALTH INSURANCE | Attending: Family Medicine

## 2017-09-04 DIAGNOSIS — L02229 Furuncle of trunk, unspecified: Secondary | ICD-10-CM

## 2017-09-04 MED ORDER — ALBUTEROL SULFATE HFA 90 MCG/ACTUATION AEROSOL INHALER
90 mcg/actuation | RESPIRATORY_TRACT | 1 refills | Status: DC | PRN
Start: 2017-09-04 — End: 2017-11-13

## 2017-09-04 MED ORDER — DOXYCYCLINE 100 MG TAB
100 mg | ORAL_TABLET | Freq: Two times a day (BID) | ORAL | 0 refills | Status: AC
Start: 2017-09-04 — End: 2017-09-11

## 2017-09-04 NOTE — Progress Notes (Signed)
Will discuss with pt at upcoming appt. Nothing acute.

## 2017-09-04 NOTE — Progress Notes (Signed)
CC: Boil and cough    HPI: Pt is a 61 y.o. female who presents for boil on back. Pt states that about a month ago she developed a spot on her right upper back that progressively got larger and was erythematous. No drainage or apparent site for drainage. No fevers. She tried OTC Boil-ease and warm compresses and did not notice any drainage but the spot got smaller and harder. It doesn't itch or hurt at all now but is still present.   She has also had a cough for about 8 weeks. She has tried OTC allergy medications and sinus rinses. Cough is dry. No fevers, congestion, rhinorrhea or sore throat. Cough is throughout the day and not significantly worse at night. She has no h/o asthma, has never smoked or lived with smokers. She does have a h/o SOB/DOE for which she had a cardiac work-up last year including a negative Lexiscan and echo with only grade I diastolic dysfunction.       Past Medical History:   Diagnosis Date   ??? Anxiety    ??? Diabetes (HCC)    ??? Hypertension    ??? Lupus    ??? Restless leg syndrome        Family History   Problem Relation Age of Onset   ??? Hypertension Mother    ??? Hypertension Father        Social History     Tobacco Use   ??? Smoking status: Never Smoker   ??? Smokeless tobacco: Never Used   Substance Use Topics   ??? Alcohol use: Yes     Alcohol/week: 0.0 oz   ??? Drug use: No       ROS:  Positive only when bolded  Constitutional: F/C, changes in weight  Ears, nose, mouth, throat, and face: Rhinorrhea, congestion, sore throat, ear pain  Respiratory: SOB, wheezing, cough    PE:  Visit Vitals  BP 117/73 (BP 1 Location: Left arm, BP Patient Position: Sitting)   Pulse 91   Temp 97 ??F (36.1 ??C) (Oral)   Resp 20   Ht 5\' 6"  (1.676 m)   Wt 233 lb (105.7 kg)   SpO2 95%   BMI 37.61 kg/m??     Gen: Pt sitting in chair, in NAD  Head: Normocephalic, atraumatic  Eyes: Sclera anicteric, EOM grossly intact, PERRL  Throat: MMM, normal lips, tongue, teeth and gums  Neck: Supple  CVS: Normal S1, S2, no m/r/g   Resp: Mild wheeze on anterior RUL field, otherwise clear but decreased air movement throughout.   Extrem: Atraumatic, no cyanosis or edema  Pulses: 2+   Skin: Warm, dry. 3x4cm area of erythema and induration on R upper back, feels like loculated cyst. No drainage.   Neuro: Alert, oriented, appropriate      A/P: Pt is a 61 y.o. female who presents for cyst and cough. Her cyst appears to be improving but is still red and indurated. Her cough is dry and lung exam most c/w RAD  - trial of albuterol for cough  - Discussed with patient, could do watch and wait vs antibiotic treatment for her inflamed cyst. Given that she also has the cough of uncertain etiology, will treat with doxycycline  - Will get her diabetic labs today and have her come back for follow-up  - RTC in 4 weeks for f/u chronic conditions and reassess cough. If still present at that time after albuterol and doxycycline, will get CXR and referral to Pulm. If cough  improves with albuterol will get PFT's.       Discussed diagnoses in detail with patient.   Medication risks/benefits/side effects discussed with patient.   All of the patient's questions were addressed. The patient understands and agrees with our plan of care.  The patient knows to call back if they are unsure of or forget any changes we discussed today or if the symptoms change.  The patient received an After-Visit Summary which contains VS, orders, medication list and allergy list. This can be used as a "mini-medical record" should they have to seek medical care while out of town.    Current Outpatient Medications on File Prior to Visit   Medication Sig Dispense Refill   ??? DULoxetine (CYMBALTA) 30 mg capsule Take 1 Cap by mouth daily. Appt required for further refills. 30 Cap 0   ??? hydroxychloroquine (PLAQUENIL) 200 mg tablet TAKE 1 TABLET TWICE A DAY 180 Tab 1   ??? hydroCHLOROthiazide (HYDRODIURIL) 25 mg tablet TAKE 1 TABLET DAILY 90 Tab 1    ??? metFORMIN ER (GLUCOPHAGE XR) 500 mg tablet TAKE 2 TABLETS TWICE A DAY 360 Tab 1   ??? pramipexole (MIRAPEX) 0.25 mg tablet TAKE 1 TABLET TWICE A DAY 180 Tab 1   ??? lisinopril (PRINIVIL, ZESTRIL) 40 mg tablet TAKE 1 TABLET DAILY 90 Tab 1   ??? ketoprofen 10 % crpk 1 Packet by Apply Externally route two (2) times a day. Apply to knees. 60 Packet 3   ??? pantoprazole (PROTONIX) 40 mg tablet Take 1 Tab by mouth daily. 30 Tab 0   ??? co-enzyme Q-10 (COQ-10) 100 mg capsule Take 1 Cap by mouth daily. 30 Cap 1     No current facility-administered medications on file prior to visit.

## 2017-09-04 NOTE — Patient Instructions (Addendum)
Cough: Care Instructions  Your Care Instructions    A cough is your body's response to something that bothers your throat or airways. Many things can cause a cough. You might cough because of a cold or the flu, bronchitis, or asthma. Smoking, postnasal drip, allergies, and stomach acid that backs up into your throat also can cause coughs.  A cough is a symptom, not a disease. Most coughs stop when the cause, such as a cold, goes away. You can take a few steps at home to cough less and feel better.  Follow-up care is a key part of your treatment and safety. Be sure to make and go to all appointments, and call your doctor if you are having problems. It's also a good idea to know your test results and keep a list of the medicines you take.  How can you care for yourself at home?  ?? Drink lots of water and other fluids. This helps thin the mucus and soothes a dry or sore throat. Honey or lemon juice in hot water or tea may ease a dry cough.  ?? Take cough medicine as directed by your doctor.  ?? Prop up your head on pillows to help you breathe and ease a dry cough.  ?? Try cough drops to soothe a dry or sore throat. Cough drops don't stop a cough. Medicine-flavored cough drops are no better than candy-flavored drops or hard candy.  ?? Do not smoke. Avoid secondhand smoke. If you need help quitting, talk to your doctor about stop-smoking programs and medicines. These can increase your chances of quitting for good.  When should you call for help?  Call 911 anytime you think you may need emergency care. For example, call if:  ?? ?? You have severe trouble breathing.   ??Call your doctor now or seek immediate medical care if:  ?? ?? You cough up blood.   ?? ?? You have new or worse trouble breathing.   ?? ?? You have a new or higher fever.   ?? ?? You have a new rash.   ??Watch closely for changes in your health, and be sure to contact your doctor if:  ?? ?? You cough more deeply or more often, especially if you notice more  mucus or a change in the color of your mucus.   ?? ?? You have new symptoms, such as a sore throat, an earache, or sinus pain.   ?? ?? You do not get better as expected.   Where can you learn more?  Go to http://www.healthwise.net/GoodHelpConnections.  Enter D279 in the search box to learn more about "Cough: Care Instructions."  Current as of: August 21, 2016  Content Version: 11.8  ?? 2006-2018 [employer]. Care instructions adapted under license by [employer] (which disclaims liability or warranty for this information). If you have questions about a medical condition or this instruction, always ask your healthcare professional. [employer] disclaims any warranty or liability for your use of this information.

## 2017-09-04 NOTE — Progress Notes (Signed)
1. Have you been to the ER, urgent care clinic since your last visit?  Hospitalized since your last visit? no    2. Have you seen or consulted any other health care providers outside of the [facility] since your last visit?  Include any pap smears or colon screening. No   Reviewed record in preparation for visit and have obtained necessary documentation.  Patient did not bring medications to visit for review.  Information provided on Advanced Directive, Living Will.  Body mass index is 37.61 kg/m??.   Health Maintenance Due   Topic Date Due   ??? Pneumococcal 19-64 Medium Risk (1 of 1 - PPSV23) 11/16/1976   ??? DTaP/Tdap/Td series (1 - Tdap) 11/17/1978   ??? Shingrix Vaccine Age 96> (1 of 2) 11/17/2007   ??? FOBT Q 1 YEAR AGE 71-75  11/17/2007   ??? FOOT EXAM Q1  09/26/2016   ??? HEMOGLOBIN A1C Q6M  10/01/2017

## 2017-09-05 LAB — METABOLIC PANEL, BASIC
BUN/Creatinine ratio: 24 — ABNORMAL HIGH (ref 9–23)
BUN: 16 mg/dL (ref 6–24)
CO2: 27 mmol/L (ref 20–29)
Calcium: 9.5 mg/dL (ref 8.7–10.2)
Chloride: 100 mmol/L (ref 96–106)
Creatinine: 0.67 mg/dL (ref 0.57–1.00)
GFR est AA: 111 mL/min/{1.73_m2} (ref >59)
GFR est non-AA: 97 mL/min/{1.73_m2} (ref >59)
Glucose: 102 mg/dL — ABNORMAL HIGH (ref 65–99)
Potassium: 4.1 mmol/L (ref 3.5–5.2)
Sodium: 142 mmol/L (ref 134–144)

## 2017-09-05 LAB — MICROALBUMIN, UR, RAND W/ MICROALB/CREAT RATIO
Creatinine, urine random: 62.5 mg/dL
Microalb/Creat ratio (ug/mg creat.): 7 mg/g creat (ref 0.0–30.0)
Microalbumin, urine: 4.4 ug/mL

## 2017-09-05 LAB — DIABETES PATIENT EDUCATION

## 2017-09-05 LAB — LIPID PANEL
Cholesterol, total: 220 mg/dL — ABNORMAL HIGH (ref 100–199)
HDL Cholesterol: 60 mg/dL (ref >39)
LDL, calculated: 90 mg/dL (ref 0–99)
Triglyceride: 348 mg/dL — ABNORMAL HIGH (ref 0–149)
VLDL, calculated: 70 mg/dL — ABNORMAL HIGH (ref 5–40)

## 2017-09-05 LAB — HEMOGLOBIN A1C WITH EAG
Estimated average glucose: 137 mg/dL
Hemoglobin A1c: 6.4 % — ABNORMAL HIGH (ref 4.8–5.6)

## 2017-09-25 ENCOUNTER — Encounter: Attending: Family Medicine

## 2017-09-25 ENCOUNTER — Ambulatory Visit: Admit: 2017-09-25 | Discharge: 2017-09-25 | Payer: PRIVATE HEALTH INSURANCE | Attending: Family Medicine

## 2017-09-25 DIAGNOSIS — J302 Other seasonal allergic rhinitis: Secondary | ICD-10-CM

## 2017-09-25 MED ORDER — LORATADINE 10 MG TAB
10 mg | ORAL_TABLET | Freq: Every day | ORAL | 3 refills | Status: AC
Start: 2017-09-25 — End: ?

## 2017-09-25 MED ORDER — FLUTICASONE 50 MCG/ACTUATION NASAL SPRAY, SUSP
50 mcg/actuation | NASAL | 2 refills | Status: AC
Start: 2017-09-25 — End: ?

## 2017-09-25 NOTE — Patient Instructions (Signed)
Seasonal Allergies: Care Instructions  Your Care Instructions  Allergies occur when your body's defense system (immune system) overreacts to certain substances. The immune system treats a harmless substance as if it were a harmful germ or virus. Many things can cause this to happen. Examples include pollens, medicine, food, dust, animal dander, and mold.  Your allergies are seasonal if you have symptoms just at certain times of the year. In that case, you are probably allergic to pollens from certain trees, grasses, or weeds.  Allergies can be mild or severe. Over-the-counter allergy medicine may help with some symptoms. Read and follow all instructions on the label.  Managing your allergies is an important part of staying healthy. Your doctor may suggest that you have tests to help find the cause of your allergies. When you know what things trigger your symptoms, you can avoid them. This can prevent allergy symptoms and other health problems.  In some cases, immunotherapy might help. For this treatment, you get shots or use pills that have a small amount of certain allergens in them. Your body "gets used to" the allergen, so you react less to it over time. This kind of treatment may help prevent or reduce some allergy symptoms.  Follow-up care is a key part of your treatment and safety. Be sure to make and go to all appointments, and call your doctor if you are having problems. It's also a good idea to know your test results and keep a list of the medicines you take.  How can you care for yourself at home?  ?? Be safe with medicines. Take your medicines exactly as prescribed. Call your doctor if you think you are having a problem with your medicine.  ?? During your allergy season, keep windows closed. If you need to use air-conditioning, change or clean all filters every month. Take a shower and change your clothes after you have been outside.  ?? Stay inside when pollen counts are high. Vacuum once or twice a week.  Use a vacuum cleaner with a HEPA filter or a double-thickness filter.  When should you call for help?  Give an epinephrine shot if:  ?? ?? You think you are having a severe allergic reaction.   ??After giving an epinephrine shot, call 911, even if you feel better.  ??Call 911 if:  ?? ?? You have symptoms of a severe allergic reaction. These may include:  ? Sudden raised, red areas (hives) all over your body.  ? Swelling of the throat, mouth, lips, or tongue.  ? Trouble breathing.  ? Passing out (losing consciousness). Or you may feel very lightheaded or suddenly feel weak, confused, or restless.   ?? ?? You have been given an epinephrine shot, even if you feel better.   ??Call your doctor now or seek immediate medical care if:  ?? ?? You have symptoms of an allergic reaction, such as:  ? A rash or hives (raised, red areas on the skin).  ? Itching.  ? Swelling.  ? Belly pain, nausea, or vomiting.   ??Watch closely for changes in your health, and be sure to contact your doctor if:  ?? ?? You do not get better as expected.   Where can you learn more?  Go to StreetWrestling.at.  Enter J912 in the search box to learn more about "Seasonal Allergies: Care Instructions."  Current as of: March 13, 2017  Content Version: 11.8  ?? 2006-2018 [employer]. Care instructions adapted under license by [employer] (  which disclaims liability or warranty for this information). If you have questions about a medical condition or this instruction, always ask your healthcare professional. Healthwise, Incorporated disclaims any warranty or liability for your use of this information.

## 2017-09-25 NOTE — Progress Notes (Signed)
Chief Complaint   Patient presents with   ??? Sinus Infection   ??? Wheezing   ??? Pink Eye     Body mass index is 37.35 kg/m??.    1. Have you been to the ER, urgent care clinic since your last visit?  Hospitalized since your last visit?No    2. Have you seen or consulted any other health care providers outside of the [facility] since your last visit?  Include any pap smears or colon screening. No    Reviewed record in preparation for visit and have necessary documentation  Pt did not bring medication to office visit for review  Information was given to pt on Advanced Directives, Living Will  Information was given on Shingles Vaccine  Opportunity was given for questions  Goals that were addressed and/or need to be completed after this appointment include:     Health Maintenance Due   Topic Date Due   ??? Pneumococcal 19-64 Medium Risk (1 of 1 - PPSV23) 11/16/1976   ??? DTaP/Tdap/Td series (1 - Tdap) 11/17/1978   ??? Shingrix Vaccine Age 33> (1 of 2) 11/17/2007   ??? FOBT Q 1 YEAR AGE 67-75  11/17/2007   ??? FOOT EXAM Q1  09/26/2016

## 2017-09-25 NOTE — Progress Notes (Signed)
[facility]   Outpatient Resident Progress Note    Encounter Date: 09/25/2017    Chief Complaint   Patient presents with   ??? Sinus Infection   ??? Wheezing   ??? Pink Eye       History of Present Illness    Patient is a 62 y.o. female, who presents to clinic for Sinus Infection; Wheezing; and Pink Eye  Patient present to clinic with nasal congestion, dry cough, mild morning sore throat, and constant clearing of throat for about 7 days. Tried Mucinex with no improvement. Have Hx of seasonal allergies with no treatment at this time. Denies chills, body aches, sweats, dizziness, ear pain or discharge, CP, SOB, wheezing, nausea, vomiting, dysuria, diarrhea, or any other complains.        Review of Systems    A complete ROS was reviewed and only pertinent items documented on HPI.  Allergies - reviewed:   Allergies   Allergen Reactions   ??? Reglan [Metoclopramide] Other (comments)     Other reaction(s): Muscle ache   Spontaneous arm movements   ??? Diclofenac Other (comments)     Elevated liver enzymes.       Medications - reviewed:   Current Outpatient Medications   Medication Sig   ??? loratadine (CLARITIN) 10 mg tablet Take 1 Tab by mouth daily.   ??? fluticasone (FLONASE) 50 mcg/actuation nasal spray 2 sprays per nostril 2 times a day for 7 days, then once a day during winter time.   ??? albuterol (PROVENTIL HFA, VENTOLIN HFA, PROAIR HFA) 90 mcg/actuation inhaler Take 1 Puff by inhalation every four (4) hours as needed for Wheezing.   ??? DULoxetine (CYMBALTA) 30 mg capsule Take 1 Cap by mouth daily. Appt required for further refills.   ??? hydroxychloroquine (PLAQUENIL) 200 mg tablet TAKE 1 TABLET TWICE A DAY   ??? hydroCHLOROthiazide (HYDRODIURIL) 25 mg tablet TAKE 1 TABLET DAILY   ??? metFORMIN ER (GLUCOPHAGE XR) 500 mg tablet TAKE 2 TABLETS TWICE A DAY   ??? pramipexole (MIRAPEX) 0.25 mg tablet TAKE 1 TABLET TWICE A DAY    ??? lisinopril (PRINIVIL, ZESTRIL) 40 mg tablet TAKE 1 TABLET DAILY     No current facility-administered medications for this visit.        Past Medical History - reviewed:  Past Medical History:   Diagnosis Date   ??? Anxiety    ??? Diabetes (HCC)    ??? Hypertension    ??? Lupus    ??? Restless leg syndrome        Family Medical History - reviewed:  Family History   Problem Relation Age of Onset   ??? Hypertension Mother    ??? Hypertension Father      Objective  Visit Vitals  BP 147/82 (BP 1 Location: Left arm, BP Patient Position: Sitting)   Pulse 95   Temp 98 ??F (36.7 ??C) (Oral)   Resp 20   Ht 5\' 6"  (1.676 m)   Wt 231 lb 6.4 oz (105 kg)   SpO2 96%   BMI 37.35 kg/m??     Body mass index is 37.35 kg/m??.    Nursing note and vitals reviewed.    Physical Exam  Constitutional: Well-developed, well-nourished, and in no distress. Obese.  HENT:   Head: Normocephalic and atraumatic. Right Ear: External ear normal. Left Ear: External ear normal.   Nose: Erythema of nasal mucosa.  Mouth/Throat: Oropharyngeal erythema.  Eyes: Conjunctivae and EOM are normal. Pupils are  equal, round, and reactive to light. Right eye exhibits no discharge. Left eye exhibits no discharge. No scleral icterus.   Neck: Normal range of motion. Neck supple. No JVD present. No tracheal deviation present. No thyromegaly present.   Lymphadenopathy: No cervical adenopathy.   Cardiovascular: Normal rate, regular rhythm, normal heart sounds and intact distal pulses. Exam reveals no gallop and no friction rub. No murmur heard.  Pulmonary/Chest: Effort normal and breath sounds normal. No respiratory distress. No wheezes, no rales, no tenderness.   Neurological: Alert and oriented. No focal abnormalities.   Skin: Skin is warm and dry. No rash noted. Not diaphoretic. No erythema. No pallor.     Assessment / Plan   Ms. Francoise SchaumannZanowski is a 62 y.o. female with the following medical condition(s):    1. Seasonal allergic rhinitis, unspecified trigger   - loratadine (CLARITIN) 10 mg tablet; Take 1 Tab by mouth daily.  Dispense: 90 Tab; Refill: 3  - fluticasone (FLONASE) 50 mcg/actuation nasal spray; 2 sprays per nostril 2 times a day for 7 days, then once a day during winter time.  Dispense: 1 Bottle; Refill: 2      Body mass index is 37.35 kg/m??. I have reviewed/discussed the above normal BMI with the patient.  I have recommended the following interventions: dietary management education, guidance, and counseling, encourage exercise and monitor weight .      Patient was advised to return to clinic in case of worsening of symptoms or fail to improve. Life threatening signs and symptoms were discussed and patient advised to go to the nearest ED for prompt evaluation and care in case of onset of any of these.     Follow-up Disposition:  Return if symptoms worsen or fail to improve.    ?? I have discussed the diagnosis with the patient and the intended plan as seen in the above orders.  The patient has received an after-visit summary and questions were answered concerning future plans.  I have discussed medication side effects and warnings with the patient as well.      Patient/Plan discussed with Dr. Sanda KleinEsquivel (Attending Physician)      Albertine GratesEduardo Hernandez-Verge, MD  PGY-3 Family Medicine Resident  Encounter Date: 09/25/2017

## 2017-09-25 NOTE — Progress Notes (Signed)
I saw and evaluated the patient, performing the key elements of the service.  I discussed the findings, assessment and plan with the resident and agree with the resident's findings and plan as documented in the resident's note.

## 2017-10-02 ENCOUNTER — Encounter: Attending: Family Medicine

## 2017-10-09 ENCOUNTER — Ambulatory Visit: Admit: 2017-10-09 | Discharge: 2017-10-09 | Payer: PRIVATE HEALTH INSURANCE | Attending: Family Medicine

## 2017-10-09 DIAGNOSIS — J40 Bronchitis, not specified as acute or chronic: Secondary | ICD-10-CM

## 2017-10-09 MED ORDER — PREDNISONE 10 MG TABLETS IN A DOSE PACK
10 mg | ORAL_TABLET | ORAL | 0 refills | Status: DC
Start: 2017-10-09 — End: 2017-10-30

## 2017-10-09 MED ORDER — AMOXICILLIN CLAVULANATE 875 MG-125 MG TAB
875-125 mg | ORAL_TABLET | Freq: Two times a day (BID) | ORAL | 0 refills | Status: AC
Start: 2017-10-09 — End: 2017-10-19

## 2017-10-09 MED ORDER — BENZONATATE 200 MG CAP
200 mg | ORAL_CAPSULE | Freq: Three times a day (TID) | ORAL | 1 refills | Status: AC | PRN
Start: 2017-10-09 — End: 2017-10-16

## 2017-10-09 NOTE — Progress Notes (Signed)
Patient: Angela Elliott MRN: [id number]  SSN: [id number]    Date of Birth: July 20, 1956  Age: 62 y.o.  Sex: female      Chief Complaint   Patient presents with   ??? Cough   ??? Chest Congestion     Angela Elliott is a 62 y.o. female presents with complaints of congestion and dry cough for several days. There has been no nausea and no vomiting . she has not had  swollen glands, myalgias, headache and fever. Symptoms are moderate. Patient is drinking plenty of fluids. There is not a hx of asthma. There is a hx of allergic rhinitis. There is not a hx of tobacco use. There have been contacts with similar infections. Patient works at [employer]. She is immunocompromised due to medication. Sh complains of seeing floaters in left eye and experiencing flashes of light. Patient with recent treatment for conjunctivitis. She says she was seen by her ophthalmologist this past summer.    BP Readings from Last 3 Encounters:   10/09/17 125/82   09/25/17 147/82   09/04/17 117/73     Wt Readings from Last 3 Encounters:   10/09/17 233 lb (105.7 kg)   09/25/17 231 lb 6.4 oz (105 kg)   09/04/17 233 lb (105.7 kg)     Body mass index is 37.61 kg/m??.      Medications:     Current Outpatient Medications   Medication Sig   ??? mometasone (NASONEX) 50 mcg/actuation nasal spray 2 Sprays.   ??? amoxicillin-clavulanate (AUGMENTIN) 875-125 mg per tablet Take 1 Tab by mouth two (2) times a day for 10 days.   ??? predniSONE (STERAPRED DS) 10 mg dose pack See administration instruction per 10mg  dose pack   ??? benzonatate (TESSALON) 200 mg capsule Take 1 Cap by mouth three (3) times daily as needed for Cough for up to 7 days.   ??? loratadine (CLARITIN) 10 mg tablet Take 1 Tab by mouth daily.   ??? fluticasone (FLONASE) 50 mcg/actuation nasal spray 2 sprays per nostril 2 times a day for 7 days, then once a day during winter time.   ??? albuterol (PROVENTIL HFA, VENTOLIN HFA, PROAIR HFA) 90 mcg/actuation  inhaler Take 1 Puff by inhalation every four (4) hours as needed for Wheezing.   ??? DULoxetine (CYMBALTA) 30 mg capsule Take 1 Cap by mouth daily. Appt required for further refills.   ??? hydroxychloroquine (PLAQUENIL) 200 mg tablet TAKE 1 TABLET TWICE A DAY   ??? hydroCHLOROthiazide (HYDRODIURIL) 25 mg tablet TAKE 1 TABLET DAILY   ??? metFORMIN ER (GLUCOPHAGE XR) 500 mg tablet TAKE 2 TABLETS TWICE A DAY   ??? pramipexole (MIRAPEX) 0.25 mg tablet TAKE 1 TABLET TWICE A DAY   ??? lisinopril (PRINIVIL, ZESTRIL) 40 mg tablet TAKE 1 TABLET DAILY     No current facility-administered medications for this visit.        Problem List:     Patient Active Problem List    Diagnosis Date Noted   ??? Seasonal allergic rhinitis 09/25/2017   ??? Type 2 diabetes with nephropathy (HCC) 05/29/2017   ??? Fibromyalgia 10/18/2016   ??? Morbid obesity due to excess calories (HCC) 06/10/2016   ??? OSA (obstructive sleep apnea) 06/10/2016   ??? DOE (dyspnea on exertion) 06/10/2016   ??? Diabetes mellitus type 2, controlled (HCC) 07/22/2014   ??? Essential hypertension 07/22/2014       Medical History:     Past Medical History:   Diagnosis Date   ???  Anxiety    ??? Diabetes (HCC)    ??? Hypertension    ??? Lupus    ??? Restless leg syndrome        Allergies:     Allergies   Allergen Reactions   ??? Reglan [Metoclopramide] Other (comments)     Other reaction(s): Muscle ache   Spontaneous arm movements   ??? Diclofenac Other (comments)     Elevated liver enzymes.       Surgical History:     Past Surgical History:   Procedure Laterality Date   ??? HX BREAST BIOPSY Left     2011-b9   ??? HX CESAREAN SECTION  1989/1991   ??? HX DILATION AND CURETTAGE  2013   ??? HX HEENT         Social History:     Social History     Socioeconomic History   ??? Marital status: MARRIED     Spouse name: Not on file   ??? Number of children: Not on file   ??? Years of education: Not on file   ??? Highest education level: Not on file   Tobacco Use   ??? Smoking status: Never Smoker   ??? Smokeless tobacco: Never Used    Substance and Sexual Activity   ??? Alcohol use: Yes     Alcohol/week: 0.0 oz   ??? Drug use: No       Review of Symptoms:  Constitutional: c/o malaise, denies fever or chills  Skin: Negative for rash or lesion  Head: Negative for facial swelling or tenderness  Eyes: Negative for redness or discharge  Ears: Negative for otalgia or decreased hearing  Nose: c/o nasal congestion, denies sinus pressure  Neck: c/o sore throat, denies lymphadenopathy   Cardiovascular: Negative for chest pain or palpitations  Respiratory: c/o non-productive cough, denies wheezing or SOB  Gastrointestinal: Negative for nausea or abdominal pain  Neurologic: Negative for headache or dizziness      Visit Vitals  BP 125/82 (BP 1 Location: Right arm, BP Patient Position: Sitting)   Pulse 89   Temp 98.2 ??F (36.8 ??C) (Oral)   Resp 18   Ht 5\' 6"  (1.676 m)   Wt 233 lb (105.7 kg)   SpO2 98%   BMI 37.61 kg/m??       Physical Examination:  General: Well developed, well nourished, in no acute distress  Skin: Warm and dry sans rash or lesion  Head: Normocephalic, atraumatic  Eyes: Sclera clear, EOMI  Ears: tympanic membranes normal in appearance  Nose: mucosal edema with rhinorrhea  Oropharynx: posterior erythema, no exudate   Neck: Normal range of motion, no lymphadenopathy  Cardiovascular: normal S1, S2, regular rate and rhythm  Respiratory: Clear to auscultation bilaterally with symmetrical, unlabored effort  Extremities: Full range of motion  Neurologic: Active, alert and oriented      Diagnoses and all orders for this visit:    1. Bronchitis  -     amoxicillin-clavulanate (AUGMENTIN) 875-125 mg per tablet; Take 1 Tab by mouth two (2) times a day for 10 days.  -     predniSONE (STERAPRED DS) 10 mg dose pack; See administration instruction per 10mg  dose pack    2. Cough  -     benzonatate (TESSALON) 200 mg capsule; Take 1 Cap by mouth three (3) times daily as needed for Cough for up to 7 days.    3. Floaters, left     4. Systemic lupus erythematosus, unspecified SLE type, unspecified organ involvement  status (HCC)        Symptomatic therapy suggested: rest, increase fluids, gargle prn for sore throat and call prn if symptoms persist or worsen.  Recommend follow up with ophthalmologist for visual  I have discussed the diagnosis with the patient and the intended plan as seen in the above orders.  The patient expresses understanding and agreement with our plan of care.   All of the patient's questions were answered to apparent satisfaction.   The patient has received an after-visit summary.   The patient knows to call our office if there are any questions or concerns regarding diagnosis and treatment plans.   I have discussed medication side effects and warnings with the patient as well.      Follow-up Disposition: Not on File

## 2017-10-09 NOTE — Progress Notes (Signed)
1. Have you been to the ER, urgent care clinic since your last visit?  Hospitalized since your last visit?No    2. Have you seen or consulted any other health care providers outside of the [facility] since your last visit?  Include any pap smears or colon screening. No  Reviewed record in preparation for visit and have necessary documentation  Pt did not bring medication to office visit for review  opportunity was given for questions  Goals that were addressed and/or need to be completed during or after this appointment include    Health Maintenance Due   Topic Date Due   ??? Pneumococcal 19-64 Medium Risk (1 of 1 - PPSV23) 11/16/1976   ??? DTaP/Tdap/Td series (1 - Tdap) 11/17/1978   ??? Shingrix Vaccine Age 18> (1 of 2) 11/17/2007   ??? FOBT Q 1 YEAR AGE 72-75  11/17/2007   ??? FOOT EXAM Q1  09/26/2016

## 2017-10-09 NOTE — Patient Instructions (Addendum)
Floaters and Flashes in Children: Care Instructions  Your Care Instructions    Floaters are spots and lines that "float" across your child's field of vision. These are caused by stray cells or strands of tissue inside the eyeball. Flashes are sparkles or lightning streaks. These occur in your child's side vision. This is called the peripheral vision.  Floaters and flashes usually aren't serious. But they can be annoying. If floaters bother your child, he or she can look up and then down. This may make them go away.  For now, your doctor doesn't think your child's symptoms are a sign of a more serious problem. But an eye exam is the only way to know for sure.  Follow-up care is a key part of your child's treatment and safety. Be sure to make and go to all appointments, and call your doctor if your child is having problems. It's also a good idea to know your child's test results and keep a list of the medicines your child takes.  When should you call for help?  Call your doctor now or seek immediate medical care if:  ?? ?? Your child has vision changes.   ??Watch closely for changes in your child's health, and be sure to contact your doctor if:  ?? ?? Your child sees new floaters.   ?? ?? Your child sees new flashes of light.   ?? ?? Your child does not get better as expected.   Where can you learn more?  Go to InsuranceStats.cahttp://www.healthwise.net/[employer]Help[employer]ctions.  Enter V424 in the search box to learn more about "Floaters and Flashes in Children: Care Instructions."  Current as of: April 01, 2017  Content Version: 11.9  ?? 2006-2018 [employer]. Care instructions adapted under license by [employer] (which disclaims liability or warranty for this information). If you have questions about a medical condition or this instruction, always ask your healthcare professional. [employer] disclaims any warranty or liability for your use of this information.

## 2017-10-13 ENCOUNTER — Encounter: Attending: Family Medicine

## 2017-10-25 ENCOUNTER — Encounter

## 2017-10-28 MED ORDER — LISINOPRIL 40 MG TAB
40 mg | ORAL_TABLET | ORAL | 1 refills | Status: DC
Start: 2017-10-28 — End: 2018-04-26

## 2017-10-28 MED ORDER — HYDROCHLOROTHIAZIDE 25 MG TAB
25 mg | ORAL_TABLET | ORAL | 1 refills | Status: DC
Start: 2017-10-28 — End: 2018-04-26

## 2017-10-28 MED ORDER — PRAMIPEXOLE 0.25 MG TAB
0.25 mg | ORAL_TABLET | ORAL | 1 refills | Status: DC
Start: 2017-10-28 — End: 2018-04-26

## 2017-10-28 MED ORDER — METFORMIN SR 500 MG 24 HR TABLET
500 mg | ORAL_TABLET | ORAL | 1 refills | Status: DC
Start: 2017-10-28 — End: 2018-04-26

## 2017-10-30 ENCOUNTER — Ambulatory Visit: Admit: 2017-10-30 | Discharge: 2017-10-30 | Payer: PRIVATE HEALTH INSURANCE | Attending: Family Medicine

## 2017-10-30 DIAGNOSIS — E119 Type 2 diabetes mellitus without complications: Secondary | ICD-10-CM

## 2017-10-30 MED ORDER — ASPIRIN 81 MG TAB, DELAYED RELEASE
81 mg | ORAL_TABLET | Freq: Every day | ORAL | 1 refills | Status: AC
Start: 2017-10-30 — End: ?

## 2017-10-30 MED ORDER — VARICELLA-ZOSTER GLYCOE VACC-AS01B ADJ(PF) 50 MCG/0.5 ML IM SUSPENSION
50 mcg/0.5 mL | Freq: Once | INTRAMUSCULAR | 0 refills | Status: AC
Start: 2017-10-30 — End: 2017-10-30

## 2017-10-30 MED ORDER — PNEUMOCOCCAL 23-VALPS VACCINE 25 MCG/0.5 ML INJECTION
25 mcg/0.5 mL | Freq: Once | INTRAMUSCULAR | 0 refills | Status: AC
Start: 2017-10-30 — End: 2017-10-30

## 2017-10-30 MED ORDER — DULOXETINE 30 MG CAP, DELAYED RELEASE
30 mg | ORAL_CAPSULE | Freq: Every day | ORAL | 3 refills | Status: DC
Start: 2017-10-30 — End: 2018-04-27

## 2017-10-30 MED ORDER — DULOXETINE 30 MG CAP, DELAYED RELEASE
30 mg | ORAL_CAPSULE | Freq: Every day | ORAL | 0 refills | Status: DC
Start: 2017-10-30 — End: 2017-10-30

## 2017-10-30 NOTE — Progress Notes (Signed)
CC: f/u DM and HTN    HPI: Pt is a 62 y.o. female who presents for f/u DM and HTN.    HTN:  Checking BPs at home?: NO  Adding salt to foods?: YES, in moderation  Headaches?: NO  Blurry vision?: NO - having lots of floaters and some flashes of light  Lower extremity edema?: NO  Smoking?: NO    DM:  Checking BG at home?: NO  Insulin dependent?: NO  Other medications for DM?: Metformin XR 1000mg  BID  Symptoms of hypoglycemia?: NO  Aspirin?: NO - will start today  ACEi/ARB?: YES  Statin?: NO - side effects  Last eye exam?: 02/2017  Last foot exam?: 09/2015  Last A1c:   Lab Results   Component Value Date/Time    Hemoglobin A1c 6.4 (H) 09/04/2017 04:35 PM    Hemoglobin A1c (POC) 6.0 09/27/2015 10:44 AM     LastLDL:   Lab Results   Component Value Date/Time    LDL, calculated 90 09/04/2017 04:35 PM     Last microalbumin:   Lab Results   Component Value Date/Time    Microalb/Creat ratio (ug/mg creat.) 7.0 09/04/2017 04:35 PM           Past Medical History:   Diagnosis Date   ??? Anxiety    ??? Diabetes (HCC)    ??? Hypertension    ??? Lupus    ??? Restless leg syndrome        Family History   Problem Relation Age of Onset   ??? Hypertension Mother    ??? Hypertension Father        Social History     Tobacco Use   ??? Smoking status: Never Smoker   ??? Smokeless tobacco: Never Used   Substance Use Topics   ??? Alcohol use: Yes     Alcohol/week: 0.0 oz   ??? Drug use: No       ROS:  Positive only when bolded  Constitutional: HA, F/C, changes in weight  Eyes: Itching/draining, changes in vision  Respiratory: SOB, wheezing, cough  Cardiovascular: CP, palpitations  Hematologic/lymphatic: LEE    PE:  Visit Vitals  BP 125/75 (BP 1 Location: Left arm, BP Patient Position: Sitting)   Pulse 98   Temp 97.3 ??F (36.3 ??C) (Oral)   Resp 16   Ht 5\' 6"  (1.676 m)   Wt 231 lb (104.8 kg)   SpO2 97%   BMI 37.28 kg/m??     Gen: Pt sitting in chair, in NAD  Head: Normocephalic, atraumatic  Eyes: Sclera anicteric, EOM grossly intact, PERRL   Throat: MMM, normal lips, tongue and gums  Neck: Supple, no LAD, no thyromegaly or carotid bruits  CVS: Normal S1, S2, no m/r/g  Resp: CTAB, no wheezes or rales  Extrem: Atraumatic, no cyanosis or edema  Feet: Skin intact, no lesions. DP pulses 2+ b/l. Sensation intact to light touch and monofilament throughout  Pulses: 2+   Skin: Warm, dry  Neuro: Alert, oriented, appropriate      A/P: Pt is a 62 y.o. female who presents for f/u HTN and DM. Labs done after last visit and reviewed with pt today, demonstrate good control.   - Rx for PPSV23 and Shingrix. Pt declines TDaP today  - Start ASA 81mg  daily  - Foot exam today  - Pt to make appt with her Ophthalmologist for the floaters and flashes of light  - RTC in 6 months for f/u chronic conditions or sooner prn  Discussed diagnoses in detail with patient.   Medication risks/benefits/side effects discussed with patient.   All of the patient's questions were addressed. The patient understands and agrees with our plan of care.  The patient knows to call back if they are unsure of or forget any changes we discussed today or if the symptoms change.  The patient received an After-Visit Summary which contains VS, orders, medication list and allergy list. This can be used as a "mini-medical record" should they have to seek medical care while out of town.    Current Outpatient Medications on File Prior to Visit   Medication Sig Dispense Refill   ??? lisinopril (PRINIVIL, ZESTRIL) 40 mg tablet TAKE 1 TABLET DAILY 90 Tab 1   ??? pramipexole (MIRAPEX) 0.25 mg tablet TAKE 1 TABLET TWICE A DAY 180 Tab 1   ??? metFORMIN ER (GLUCOPHAGE XR) 500 mg tablet TAKE 2 TABLETS TWICE A DAY 360 Tab 1   ??? hydroCHLOROthiazide (HYDRODIURIL) 25 mg tablet TAKE 1 TABLET DAILY 90 Tab 1   ??? loratadine (CLARITIN) 10 mg tablet Take 1 Tab by mouth daily. 90 Tab 3   ??? fluticasone (FLONASE) 50 mcg/actuation nasal spray 2 sprays per nostril  2 times a day for 7 days, then once a day during winter time. 1 Bottle 2   ??? albuterol (PROVENTIL HFA, VENTOLIN HFA, PROAIR HFA) 90 mcg/actuation inhaler Take 1 Puff by inhalation every four (4) hours as needed for Wheezing. 1 Inhaler 1   ??? DULoxetine (CYMBALTA) 30 mg capsule Take 1 Cap by mouth daily. Appt required for further refills. 30 Cap 0   ??? hydroxychloroquine (PLAQUENIL) 200 mg tablet TAKE 1 TABLET TWICE A DAY 180 Tab 1   ??? mometasone (NASONEX) 50 mcg/actuation nasal spray 2 Sprays.       No current facility-administered medications on file prior to visit.

## 2017-10-30 NOTE — Patient Instructions (Addendum)
A Healthy Lifestyle: Care Instructions  Your Care Instructions    A healthy lifestyle can help you feel good, stay at a healthy weight, and have plenty of energy for both work and play. A healthy lifestyle is something you can share with your whole family.  A healthy lifestyle also can lower your risk for serious health problems, such as high blood pressure, heart disease, and diabetes.  You can follow a few steps listed below to improve your health and the health of your family.  Follow-up care is a key part of your treatment and safety. Be sure to make and go to all appointments, and call your doctor if you are having problems. It's also a good idea to know your test results and keep a list of the medicines you take.  How can you care for yourself at home?  ?? Do not eat too much sugar, fat, or fast foods. You can still have dessert and treats now and then. The goal is moderation.  ?? Start small to improve your eating habits. Pay attention to portion sizes, drink less juice and soda pop, and eat more fruits and vegetables.  ? Eat a healthy amount of food. A 3-ounce serving of meat, for example, is about the size of a deck of cards. Fill the rest of your plate with vegetables and whole grains.  ? Limit the amount of soda and sports drinks you have every day. Drink more water when you are thirsty.  ? Eat at least 5 servings of fruits and vegetables every day. It may seem like a lot, but it is not hard to reach this goal. A serving or helping is 1 piece of fruit, 1 cup of vegetables, or 2 cups of leafy, raw vegetables. Have an apple or some carrot sticks as an afternoon snack instead of a candy bar. Try to have fruits and/or vegetables at every meal.  ?? Make exercise part of your daily routine. You may want to start with simple activities, such as walking, bicycling, or slow swimming. Try to be active 30 to 60 minutes every day. You do not need to do all 30 to 60  minutes all at once. For example, you can exercise 3 times a day for 10 or 20 minutes. Moderate exercise is safe for most people, but it is always a good idea to talk to your doctor before starting an exercise program.  ?? Keep moving. Mow the lawn, work in the garden, or clean your house. Take the stairs instead of the elevator at work.  ?? If you smoke, quit. People who smoke have an increased risk for heart attack, stroke, cancer, and other lung illnesses. Quitting is hard, but there are ways to boost your chance of quitting tobacco for good.  ? Use nicotine gum, patches, or lozenges.  ? Ask your doctor about stop-smoking programs and medicines.  ? Keep trying.  In addition to reducing your risk of diseases in the future, you will notice some benefits soon after you stop using tobacco. If you have shortness of breath or asthma symptoms, they will likely get better within a few weeks after you quit.  ?? Limit how much alcohol you drink. Moderate amounts of alcohol (up to 2 drinks a day for men, 1 drink a day for women) are okay. But drinking too much can lead to liver problems, high blood pressure, and other health problems.  Family health  If you have a family, there are many things you   can do together to improve your health.  ?? Eat meals together as a family as often as possible.  ?? Eat healthy foods. This includes fruits, vegetables, lean meats and dairy, and whole grains.  ?? Include your family in your fitness plan. Most people think of activities such as jogging or tennis as the way to fitness, but there are many ways you and your family can be more active. Anything that makes you breathe hard and gets your heart pumping is exercise. Here are some tips:  ? Walk to do errands or to take your child to school or the bus.  ? Go for a family bike ride after dinner instead of watching TV.  Where can you learn more?  Go to http://www.healthwise.net/GoodHelpConnections.   Enter U807 in the search box to learn more about "A Healthy Lifestyle: Care Instructions."  Current as of: May 27, 2017  Content Version: 11.9  ?? 2006-2018 [employer]. Care instructions adapted under license by [employer] (which disclaims liability or warranty for this information). If you have questions about a medical condition or this instruction, always ask your healthcare professional. [employer] disclaims any warranty or liability for your use of this information.

## 2017-10-30 NOTE — Progress Notes (Signed)
1. Have you been to the ER, urgent care clinic since your last visit?  Hospitalized since your last visit?no    2. Have you seen or consulted any other health care providers outside of the [facility] since your last visit?  Include any pap smears or colon screening. no  Reviewed record in preparation for visit and have obtained necessary documentation.  Patient did not bring medications to visit for review.  Information provided on Advanced Directive, Living Will.  Body mass index is 37.28 kg/m??.   Health Maintenance Due   Topic Date Due   ??? Pneumococcal 19-64 Medium Risk (1 of 1 - PPSV23) 11/16/1976   ??? DTaP/Tdap/Td series (1 - Tdap) 11/17/1978   ??? Shingrix Vaccine Age 41> (1 of 2) 11/17/2007   ??? FOBT Q 1 YEAR AGE 65-75  11/17/2007   ??? FOOT EXAM Q1  09/26/2016

## 2017-11-13 ENCOUNTER — Encounter

## 2017-11-14 MED ORDER — VENTOLIN HFA 90 MCG/ACTUATION AEROSOL INHALER
90 mcg/actuation | RESPIRATORY_TRACT | 5 refills | Status: AC
Start: 2017-11-14 — End: ?

## 2017-11-22 ENCOUNTER — Encounter

## 2017-11-24 MED ORDER — HYDROXYCHLOROQUINE 200 MG TAB
200 mg | ORAL_TABLET | ORAL | 1 refills | Status: DC
Start: 2017-11-24 — End: 2018-04-27

## 2018-01-28 ENCOUNTER — Ambulatory Visit: Admit: 2018-01-28 | Discharge: 2018-01-28 | Payer: PRIVATE HEALTH INSURANCE | Attending: Family Medicine

## 2018-01-28 NOTE — Progress Notes (Signed)
Patient walked out prior to being seen.

## 2018-01-28 NOTE — Progress Notes (Signed)
1. Have you been to the ER, urgent care clinic since your last visit?  Hospitalized since your last visit?No    2. Have you seen or consulted any other health care providers outside of the [facility] since your last visit?  Include any pap smears or colon screening. No  Reviewed record in preparation for visit and have necessary documentation  Pt did not bring medication to office visit for review    Goals that were addressed and/or need to be completed during or after this appointment include   Health Maintenance Due   Topic Date Due   ??? Pneumococcal 0-64 years (1 of 1 - PPSV23) 11/17/1963   ??? DTaP/Tdap/Td series (1 - Tdap) 11/17/1978   ??? Shingrix Vaccine Age 45> (1 of 2) 11/17/2007   ??? EYE EXAM RETINAL OR DILATED  01/25/2018

## 2018-01-29 ENCOUNTER — Ambulatory Visit: Admit: 2018-01-29 | Discharge: 2018-01-29 | Payer: PRIVATE HEALTH INSURANCE | Attending: Family Medicine

## 2018-01-29 ENCOUNTER — Encounter: Attending: Family Medicine

## 2018-01-29 DIAGNOSIS — J690 Pneumonitis due to inhalation of food and vomit: Secondary | ICD-10-CM

## 2018-01-29 MED ORDER — LEVOFLOXACIN 750 MG TAB
750 mg | ORAL_TABLET | Freq: Every day | ORAL | 0 refills | Status: AC
Start: 2018-01-29 — End: 2018-02-03

## 2018-01-29 NOTE — Progress Notes (Signed)
1. Have you been to the ER, urgent care clinic since your last visit?  Hospitalized since your last visit? no    2. Have you seen or consulted any other health care providers outside of the [facility] since your last visit?  Include any pap smears or colon screening. no  Reviewed record in preparation for visit and have obtained necessary documentation.  Patient did not bring medications to visit for review.  Information provided on Advanced Directive, Living Will.  Body mass index is 37.93 kg/m??.   Health Maintenance Due   Topic Date Due   ??? Pneumococcal 0-64 years (1 of 1 - PPSV23) 11/17/1963   ??? DTaP/Tdap/Td series (1 - Tdap) 11/17/1978   ??? Shingrix Vaccine Age 28> (1 of 2) 11/17/2007   ??? EYE EXAM RETINAL OR DILATED  01/25/2018     - check for functional glucose monitor and record keeping system  Pt was given BS record log to document home readings and return to office for review  Diabetic Bundle:  LDL-  A1C-  BP-  Smoking?  Anticoagulation medication?   Eye exam dilated?  Foot exam?

## 2018-01-29 NOTE — Progress Notes (Signed)
CC: Cough    HPI: Pt is a 62 y.o. female who presents for cough. Last Friday she had a bad episode of GERD while she was sleeping and soon after developed productive cough which has been getting worse. Associated with temps to 100 and mild nasal congestion but no rhinorrhea or other allergy symptoms and she has been taking loratidine, flonase, nasal rinses and mucinex which have not helped.       Past Medical History:   Diagnosis Date   ??? Anxiety    ??? Diabetes (HCC)    ??? Hypertension    ??? Lupus (HCC)    ??? Restless leg syndrome        Family History   Problem Relation Age of Onset   ??? Hypertension Mother    ??? Hypertension Father        Social History     Tobacco Use   ??? Smoking status: Never Smoker   ??? Smokeless tobacco: Never Used   Substance Use Topics   ??? Alcohol use: Yes     Alcohol/week: 0.0 oz   ??? Drug use: No       ROS:  Positive only when bolded  Constitutional: HA, F/C, changes in weight  Ears, nose, mouth, throat, and face: Rhinorrhea, congestion, sore throat, ear pain  Respiratory: SOB, wheezing, cough    PE:  Visit Vitals  BP 144/83 (BP 1 Location: Left arm, BP Patient Position: Sitting)   Pulse 94   Temp 98 ??F (36.7 ??C) (Oral)   Resp 16   Ht  (1.676 m)   Wt 235 lb (106.6 kg)   SpO2 94%   BMI 37.93 kg/m??     Gen: Pt sitting in chair, in NAD  Head: Normocephalic, atraumatic  Eyes: Sclera anicteric, EOM grossly intact, PERRL  Ears: TM's pearly with good light reflex b/l  Nose: Normal nasal mucosa  Throat: MMM, normal lips, tongue and gums  Neck: Supple, no LAD  CVS: Normal S1, S2, no m/r/g  Resp: CTAB, no wheezes or rales. Decent ar movement throughout.   Extrem: Atraumatic, no cyanosis or edema  Pulses: 2+   Skin: Warm, dry  Neuro: Alert, oriented, appropriate      A/P: Pt is a 62 y.o. female who presents for cough, concern for bronchitis vs aspiration PNA given history of symptoms starting just after choking on regurgitated food while lying flat.    - Discussed treatment options with pt and desire to cover for both potential bacterial infections. Will do Levofloxacin  daily for 7 days  - She has some Tessalon perles at home, advised she can use those for relief from cough at night  - RTC prn if symptoms worsen or fail to improve      Discussed diagnoses in detail with patient.   Medication risks/benefits/side effects discussed with patient.   All of the patient's questions were addressed. The patient understands and agrees with our plan of care.  The patient knows to call back if they are unsure of or forget any changes we discussed today or if the symptoms change.  The patient received an After-Visit Summary which contains VS, orders, medication list and allergy list. This can be used as a "mini-medical record" should they have to seek medical care while out of town.    Current Outpatient Medications on File Prior to Visit   Medication Sig Dispense Refill   ??? hydroxychloroquine (PLAQUENIL) 200 mg tablet TAKE 1 TABLET TWICE A DAY 180 Tab 1   ???  VENTOLIN HFA 90 mcg/actuation inhaler INHALE 1 PUFF BY MOUTH EVERY 4 HOURS AS NEEDED FOR WHEEZING 1 Inhaler 5   ??? aspirin delayed-release 81 mg tablet Take 1 Tab by mouth daily. 180 Tab 1   ??? DULoxetine (CYMBALTA) 30 mg capsule Take 1 Cap by mouth daily. 90 Cap 3   ??? lisinopril (PRINIVIL, ZESTRIL) 40 mg tablet TAKE 1 TABLET DAILY 90 Tab 1   ??? pramipexole (MIRAPEX) 0.25 mg tablet TAKE 1 TABLET TWICE A DAY 180 Tab 1   ??? metFORMIN ER (GLUCOPHAGE XR) 500 mg tablet TAKE 2 TABLETS TWICE A DAY 360 Tab 1   ??? hydroCHLOROthiazide (HYDRODIURIL) 25 mg tablet TAKE 1 TABLET DAILY 90 Tab 1   ??? loratadine (CLARITIN) 10 mg tablet Take 1 Tab by mouth daily. 90 Tab 3   ??? fluticasone (FLONASE) 50 mcg/actuation nasal spray 2 sprays per nostril 2 times a day for 7 days, then once a day during winter time. 1 Bottle 2     No current facility-administered medications on file prior to visit.

## 2018-01-29 NOTE — Patient Instructions (Addendum)
Aspiration Pneumonia: Care Instructions  Your Care Instructions    Aspiration pneumonia is an inflammation of the lungs. It may occur after you breathe in large amounts of foreign material, such as food, liquid, vomit, or mucus.  Aspiration may happen because of a health problem that makes it hard to swallow. These problems include stroke or seizure.  Pneumonia makes it hard to breathe.  Follow-up care is a key part of your treatment and safety. Be sure to make and go to all appointments, and call your doctor if you are having problems. It's also a good idea to know your test results and keep a list of the medicines you take.  How can you care for yourself at home?  To help with swallowing  ?? You may need to do exercises to train your muscles to work together to help you swallow. You may also need to learn how to position your body or how to put food in your mouth to be able to swallow better.  ?? You may need to change the foods you eat. Your doctor may tell you to eat certain foods and liquids to make swallowing easier.  ?? You may need to change how you prepare foods. For example, you may need to add thickeners to some liquids, or puree certain foods in a blender.  To help with pneumonia  ?? Take your antibiotics as directed. Do not stop taking them just because you feel better. You need to take the full course of antibiotics.  ?? Take your medicines exactly as prescribed. For example, your doctor may have given you medicine that makes breathing easier. Call your doctor if you think you are having a problem with your medicine.  ?? Get plenty of rest and sleep. You may feel weak and tired for a while, but your energy level will improve with time.  ?? Take care of your cough so you can rest. A cough that brings up mucus from your lungs is common with pneumonia. It is one way your body gets rid of the infection. But if coughing keeps you from resting or causes severe  fatigue and chest-wall pain, talk to your doctor. He or she may suggest that you take a medicine to reduce the cough.  ?? Use a humidifier to increase the moisture in the air. Dry air makes coughing worse. Follow the instructions for cleaning the machine.  ?? Do not smoke, and avoid others' smoke. Smoke will make your cough last longer. If you need help quitting, talk to your doctor about stop-smoking programs and medicines. These can increase your chances of quitting for good.  ?? Take an over-the-counter pain medicine, such as acetaminophen (Tylenol), ibuprofen (Advil, Motrin), or naproxen (Aleve) to help reduce fever and reduce chest pain caused by coughing. Read and follow all instructions on the label.  ?? Do not take two or more pain medicines at the same time unless the doctor told you to. Many pain medicines have acetaminophen, which is Tylenol. Too much acetaminophen (Tylenol) can be harmful.  When should you call for help?  Call 911 anytime you think you may need emergency care. For example, call if:  ?? ?? You have severe trouble breathing.   ??Call your doctor now or seek immediate medical care if:  ?? ?? You have a new or higher fever.   ?? ?? You have new or worse trouble breathing.   ?? ?? You cough up blood.   ?? ?? You are dizzy or   lightheaded, or you feel like you may faint.   ??Watch closely for changes in your health, and be sure to contact your doctor if:  ?? ?? You do not get better as expected.   ?? ?? You are coughing more deeply or more often.   Where can you learn more?  Go to http://www.healthwise.net/GoodHelpConnections.  Enter D757 in the search box to learn more about "Aspiration Pneumonia: Care Instructions."  Current as of: May 21, 2017  Content Version: 11.9  ?? 2006-2018 [employer]. Care instructions adapted under license by [employer] (which disclaims liability or warranty for this information). If you have questions about a medical condition or  this instruction, always ask your healthcare professional. [employer] disclaims any warranty or liability for your use of this information.

## 2018-02-16 ENCOUNTER — Ambulatory Visit: Attending: Primary Care

## 2018-02-16 ENCOUNTER — Ambulatory Visit: Admit: 2018-02-16 | Discharge: 2018-02-16 | Payer: PRIVATE HEALTH INSURANCE | Attending: Primary Care

## 2018-02-16 DIAGNOSIS — K219 Gastro-esophageal reflux disease without esophagitis: Secondary | ICD-10-CM

## 2018-02-16 MED ORDER — PANTOPRAZOLE 40 MG TAB, DELAYED RELEASE
40 mg | ORAL_TABLET | Freq: Every day | ORAL | 0 refills | Status: AC
Start: 2018-02-16 — End: ?

## 2018-02-16 NOTE — Progress Notes (Signed)
Angela Elliott  62 y.o. female  12-Apr-1956  [address]  [place] [address]  [id number]     [facility]: Progress Note       Encounter Date: 02/16/2018    Chief Complaint   Patient presents with   ??? Cough     heart burn   ??? Shoulder Pain     left     History of Present Illness   Angela Elliott is a 62 y.o. female who presents to clinic today for:    Heartburn-Per the patient-hx of ibuprofen abuse. GERD worse at night-aspiration pneumonia and treated with antibiotics. Protonix 05/2017 felt good and stopped after ~30 days. Started OTC nexium about 4 days ago and symptoms have subsided. Current symptoms include-decreased appetite, sleep sitting up and can taste the reflux the next morning, belching. Triggers-tomato products, late night eating, spicy foods. Worsening cough not controlled with her inhaler.     Left shoulder- states over the weekend she noted left "deep shoulder pain". Denies injury or MVA, numbness/tingling in extremities, bug bites. Treatments-CBD oil & warm compress with very little relief. Nothing makes it better. Pain woke her up at last night. States several family members have had MI. She states she has worsening fatigue and is diaphoretic since Saturday.     Health Maintenance    Health Maintenance Due   Topic Date Due   ??? Pneumococcal 0-64 years (1 of 1 - PPSV23) 11/17/1963   ??? DTaP/Tdap/Td series (1 - Tdap) 11/17/1978   ??? Shingrix Vaccine Age 17> (1 of 2) 11/17/2007   ??? EYE EXAM RETINAL OR DILATED  01/25/2018   ??? HEMOGLOBIN A1C Q6M  03/05/2018     Review of Systems   Review of Systems   Constitutional: Negative.    HENT: Negative.    Eyes: Negative.    Respiratory: Positive for cough.    Cardiovascular: Negative.    Gastrointestinal: Positive for heartburn.   Genitourinary: Negative.    Musculoskeletal: Positive for myalgias.   Skin: Negative.    Neurological: Negative.    Endo/Heme/Allergies: Negative.        Vitals/Objective:     Vitals:    02/16/18 1549   BP: 120/71    Pulse: (!) 118   Resp: 18   Temp: 97.2 ??F (36.2 ??C)   TempSrc: Oral   SpO2: 95%   Weight: 233 lb 6.4 oz (105.9 kg)   Height: 5\' 6"  (1.676 m)     Body mass index is 37.67 kg/m??.    Physical Exam   Musculoskeletal:        Left shoulder: She exhibits pain and spasm. She exhibits normal range of motion, no tenderness, no swelling, no deformity, normal pulse and normal strength.   Skin: Skin is warm and intact. She is diaphoretic.   Psychiatric: She has a normal mood and affect. Her speech is normal and behavior is normal. Judgment and thought content normal. Cognition and memory are normal.       No results found for this or any previous visit (from the past 24 hour(s)).  Assessment and Plan:   1. Gastroesophageal reflux disease without esophagitis      2. Acute pain of left shoulder    Patient states she does not feel well and will proceed to [facility] ED-she states she would like to r/o any cardiac issues. She states she did not go to work today due to the fatigue. She states this is not her baseline.  Printed the most recent labs and medication list for her to take to the ED. Discussed that GERD can mimic chest pain. Given her vague symptoms in agreement with follow up.   Also discussed trying protonix for the next 8 weeks and if not better referral to GI for evaluation.       I have discussed the diagnosis with the patient and the intended plan as seen in the above orders.  she has expressed understanding.  The patient has received an after-visit summary and questions were answered concerning future plans.  I have discussed medication side effects and warnings with the patient as well.    Electronically Signed: Chauncey Fischer, NP     History/Allergies   Patients past medical, surgical and family histories were reviewed and updated.    Past Medical History:   Diagnosis Date   ??? Anxiety    ??? Diabetes (HCC)    ??? Hypertension    ??? Lupus (HCC)    ??? Restless leg syndrome       Past Surgical History:    Procedure Laterality Date   ??? HX BREAST BIOPSY Left     2011-b9   ??? HX CESAREAN SECTION  1989/1991   ??? HX DILATION AND CURETTAGE  2013   ??? HX HEENT       Family History   Problem Relation Age of Onset   ??? Hypertension Mother    ??? Hypertension Father      Social History     Socioeconomic History   ??? Marital status: MARRIED     Spouse name: Not on file   ??? Number of children: Not on file   ??? Years of education: Not on file   ??? Highest education level: Not on file   Occupational History   ??? Not on file   Social Needs   ??? Financial resource strain: Not on file   ??? Food insecurity:     Worry: Not on file     Inability: Not on file   ??? Transportation needs:     Medical: Not on file     Non-medical: Not on file   Tobacco Use   ??? Smoking status: Never Smoker   ??? Smokeless tobacco: Never Used   Substance and Sexual Activity   ??? Alcohol use: Yes     Alcohol/week: 0.0 oz   ??? Drug use: No   ??? Sexual activity: Not on file   Lifestyle   ??? Physical activity:     Days per week: Not on file     Minutes per session: Not on file   ??? Stress: Not on file   Relationships   ??? Social connections:     Talks on phone: Not on file     Gets together: Not on file     Attends religious service: Not on file     Active member of club or organization: Not on file     Attends meetings of clubs or organizations: Not on file     Relationship status: Not on file   ??? Intimate partner violence:     Fear of current or ex partner: Not on file     Emotionally abused: Not on file     Physically abused: Not on file     Forced sexual activity: Not on file   Other Topics Concern   ??? Not on file   Social History Narrative   ??? Not on file  Allergies   Allergen Reactions   ??? Reglan [Metoclopramide] Other (comments)     Other reaction(s): Muscle ache   Spontaneous arm movements   ??? Diclofenac Other (comments)     Elevated liver enzymes.   ??? Statins-Hmg-Coa Reductase Inhibitors Nausea and Vomiting       Disposition         No future appointments.          Current Medications after this visit     Current Outpatient Medications   Medication Sig   ??? pantoprazole (PROTONIX) 40 mg tablet Take 1 Tab by mouth daily. Indications: gastroesophageal reflux disease   ??? hydroxychloroquine (PLAQUENIL) 200 mg tablet TAKE 1 TABLET TWICE A DAY   ??? VENTOLIN HFA 90 mcg/actuation inhaler INHALE 1 PUFF BY MOUTH EVERY 4 HOURS AS NEEDED FOR WHEEZING   ??? aspirin delayed-release 81 mg tablet Take 1 Tab by mouth daily.   ??? DULoxetine (CYMBALTA) 30 mg capsule Take 1 Cap by mouth daily.   ??? lisinopril (PRINIVIL, ZESTRIL) 40 mg tablet TAKE 1 TABLET DAILY   ??? pramipexole (MIRAPEX) 0.25 mg tablet TAKE 1 TABLET TWICE A DAY   ??? metFORMIN ER (GLUCOPHAGE XR) 500 mg tablet TAKE 2 TABLETS TWICE A DAY   ??? hydroCHLOROthiazide (HYDRODIURIL) 25 mg tablet TAKE 1 TABLET DAILY   ??? loratadine (CLARITIN) 10 mg tablet Take 1 Tab by mouth daily.   ??? fluticasone (FLONASE) 50 mcg/actuation nasal spray 2 sprays per nostril 2 times a day for 7 days, then once a day during winter time.     No current facility-administered medications for this visit.      There are no discontinued medications.

## 2018-02-16 NOTE — Patient Instructions (Signed)
Gastroesophageal Reflux Disease (GERD): Care Instructions  Your Care Instructions    Gastroesophageal reflux disease (GERD) is the backward flow of stomach acid into the esophagus. The esophagus is the tube that leads from your throat to your stomach. A one-way valve prevents the stomach acid from moving up into this tube. When you have GERD, this valve does not close tightly enough.  If you have mild GERD symptoms including heartburn, you may be able to control the problem with antacids or over-the-counter medicine. Changing your diet, losing weight, and making other lifestyle changes can also help reduce symptoms.  Follow-up care is a key part of your treatment and safety. Be sure to make and go to all appointments, and call your doctor if you are having problems. It's also a good idea to know your test results and keep a list of the medicines you take.  How can you care for yourself at home?  ?? Take your medicines exactly as prescribed. Call your doctor if you think you are having a problem with your medicine.  ?? Your doctor may recommend over-the-counter medicine. For mild or occasional indigestion, antacids, such as Tums, Gaviscon, Mylanta, or Maalox, may help. Your doctor also may recommend over-the-counter acid reducers, such as Pepcid AC, Tagamet HB, Zantac 75, or Prilosec. Read and follow all instructions on the label. If you use these medicines often, talk with your doctor.  ?? Change your eating habits.  ? It's best to eat several small meals instead of two or three large meals.  ? After you eat, wait 2 to 3 hours before you lie down.  ? Chocolate, mint, and alcohol can make GERD worse.  ? Spicy foods, foods that have a lot of acid (like tomatoes and oranges), and coffee can make GERD symptoms worse in some people. If your symptoms are worse after you eat a certain food, you may want to stop eating that food to see if your symptoms get better.   ?? Do not smoke or chew tobacco. Smoking can make GERD worse. If you need help quitting, talk to your doctor about stop-smoking programs and medicines. These can increase your chances of quitting for good.  ?? If you have GERD symptoms at night, raise the head of your bed 6 to 8 inches by putting the frame on blocks or placing a foam wedge under the head of your mattress. (Adding extra pillows does not work.)  ?? Do not wear tight clothing around your middle.  ?? Lose weight if you need to. Losing just 5 to 10 pounds can help.  When should you call for help?  Call your doctor now or seek immediate medical care if:  ?? ?? You have new or different belly pain.   ?? ?? Your stools are black and tarlike or have streaks of blood.   ??Watch closely for changes in your health, and be sure to contact your doctor if:  ?? ?? Your symptoms have not improved after 2 days.   ?? ?? Food seems to catch in your throat or chest.   Where can you learn more?  Go to http://www.healthwise.net/[employer].  Enter T927 in the search box to learn more about "Gastroesophageal Reflux Disease (GERD): Care Instructions."  Current as of: December 10, 2016  Content Version: 11.9  ?? 2006-2018 [employer]. Care instructions adapted under license by [employer] (which disclaims liability or warranty for this information). If you have questions about a medical condition or this instruction, always ask   your healthcare professional. Healthwise, Inc[employer] disclaims any warranty or liability for your use of this information.

## 2018-02-16 NOTE — Progress Notes (Signed)
Chief Complaint   Patient presents with   ??? Cough     heart burn   ??? Shoulder Pain     left     Visit Vitals  BP 120/71 (BP 1 Location: Right arm, BP Patient Position: Sitting)   Pulse (!) 118   Temp 97.2 ??F (36.2 ??C) (Oral)   Resp 18   Ht 5' 6" (1.676 m)   Wt 233 lb 6.4 oz (105.9 kg)   SpO2 95%   BMI 37.67 kg/m??     1. Have you been to the ER, urgent care clinic since your last visit?  Hospitalized since your last visit?No    2. Have you seen or consulted any other health care providers outside of the [facility] since your last visit?  Include any pap smears or colon screening. No    Reviewed record in preparation for visit and have necessary documentation  Pt did not bring medication to office visit for review  opportunity was given for questions  Goals that were addressed and/or need to be completed during or after this appointment include   Health Maintenance Due   Topic Date Due   ??? Pneumococcal 0-64 years (1 of 1 - PPSV23) 11/17/1963   ??? DTaP/Tdap/Td series (1 - Tdap) 11/17/1978   ??? Shingrix Vaccine Age 50> (1 of 2) 11/17/2007   ??? EYE EXAM RETINAL OR DILATED  01/25/2018   ??? HEMOGLOBIN A1C Q6M  03/05/2018

## 2018-02-16 NOTE — Progress Notes (Signed)
 Chief Complaint   Patient presents with   . Cough     heart burn   . Shoulder Pain     left     Visit Vitals  BP 120/71 (BP 1 Location: Right arm, BP Patient Position: Sitting)   Pulse (!) 118   Temp 97.2 F (36.2 C) (Oral)   Resp 18   Ht 5' 6 (1.676 m)   Wt 233 lb 6.4 oz (105.9 kg)   SpO2 95%   BMI 37.67 kg/m     1. Have you been to the ER, urgent care clinic since your last visit?  Hospitalized since your last visit?No    2. Have you seen or consulted any other health care providers outside of the [facility] since your last visit?  Include any pap smears or colon screening. No    Reviewed record in preparation for visit and have necessary documentation  Pt did not bring medication to office visit for review  opportunity was given for questions  Goals that were addressed and/or need to be completed during or after this appointment include   Health Maintenance Due   Topic Date Due   . Pneumococcal 0-64 years (1 of 1 - PPSV23) 11/17/1963   . DTaP/Tdap/Td series (1 - Tdap) 11/17/1978   . Shingrix Vaccine Age 90> (1 of 2) 11/17/2007   . EYE EXAM RETINAL OR DILATED  01/25/2018   . HEMOGLOBIN A1C Q6M  03/05/2018

## 2018-02-16 NOTE — Progress Notes (Signed)
Progress Notes by Chauncey Fischer, NP at 02/16/18 1550                Author: Chauncey Fischer, NP  Service: --  Author Type: Nurse Practitioner       Filed: 02/16/18 2028  Encounter Date: 02/16/2018  Status: Signed          Editor: Chauncey Fischer, NP (Nurse Practitioner)                  Angela Elliott   62 y.o. female   June 03, 1956   [address]   [place] [address]   [id number]          [facility]: Progress Note           Encounter Date: 02/16/2018      Chief Complaint      Patient presents with      ?  Cough          heart burn      ?  Shoulder Pain          left            History of Present Illness         Angela Elliott is a 62 y.o.  female who presents to clinic today for:      Heartburn-Per the patient-hx of ibuprofen abuse. GERD worse at night-aspiration pneumonia and treated with antibiotics. Protonix 05/2017 felt  good and stopped after ~30 days. Started OTC nexium about 4 days ago and symptoms have subsided. Current symptoms include-decreased appetite, sleep sitting up and can taste the reflux the next morning, belching. Triggers-tomato products, late night eating,  spicy foods. Worsening cough not controlled with her inhaler.       Left shoulder- states over the weekend she noted left "deep shoulder pain". Denies injury or MVA, numbness/tingling in extremities, bug bites.  Treatments-CBD oil & warm compress with very little relief. Nothing makes it better. Pain woke her up at last night. States several family members have had MI. She states she has worsening fatigue and is diaphoretic since Saturday.       Health Maintenance      Health Maintenance Due      Topic  Date Due      ?  Pneumococcal 0-64 years (1 of 1 - PPSV23)  11/17/1963      ?  DTaP/Tdap/Td series (1 - Tdap)  11/17/1978      ?  Shingrix Vaccine Age 72> (1 of 2)  11/17/2007      ?  EYE EXAM RETINAL OR DILATED   01/25/2018      ?  HEMOGLOBIN A1C Q6M   03/05/2018            Review of  Systems         Review of Systems    Constitutional: Negative.     HENT: Negative.     Eyes: Negative.     Respiratory: Positive for cough.     Cardiovascular: Negative.     Gastrointestinal: Positive for heartburn.    Genitourinary: Negative.     Musculoskeletal: Positive for myalgias.    Skin: Negative.     Neurological: Negative.     Endo/Heme/Allergies: Negative.           Vitals/Objective:            Vitals:        02/16/18 1549      BP:  120/71      Pulse:  (!) 118      Resp:  18      Temp:  97.2 ??F (36.2 ??C)      TempSrc:  Oral      SpO2:  95%      Weight:  233 lb 6.4 oz (105.9 kg)      Height:  5\' 6"  (1.676 m)            Body mass index is 37.67 kg/m??.      Physical Exam   Musculoskeletal:        Left shoulder: She exhibits  pain and spasm. She exhibits normal range of motion, no tenderness, no swelling,  no deformity, normal pulse and normal strength.    Skin: Skin is warm and intact. She is diaphoretic.   Psychiatric: She has a normal mood and affect. Her speech is normal and behavior is normal. Judgment and thought content normal. Cognition and memory are normal.          No results found for this or any previous visit (from the past 24 hour(s)).   Assessment and Plan:         1. Gastroesophageal reflux disease without esophagitis         2. Acute pain of left shoulder      Patient states she does not feel well and will proceed to [facility] ED-she states she would like to r/o any cardiac issues. She states she did not go to work today due to the fatigue. She  states this is not her baseline. Printed the most recent labs and medication list for her to take to the ED. Discussed that GERD can mimic chest pain. Given her vague symptoms in agreement with follow up.    Also discussed trying protonix for the next 8 weeks and if not better referral to GI for evaluation.         I have discussed the diagnosis with the patient and the intended plan as seen in the above orders.  she has expressed  understanding.  The patient  has received an after-visit summary and questions were answered concerning future plans.  I have discussed medication side effects and warnings with the patient as well.      Electronically Signed: Chauncey Fischer, NP        History/Allergies         Patients past medical, surgical and family histories were reviewed and updated.     Past Medical History:      Diagnosis  Date      ?  Anxiety        ?  Diabetes (HCC)        ?  Hypertension        ?  Lupus (HCC)        ?  Restless leg syndrome               Past Surgical History:      Procedure  Laterality  Date      ?  HX BREAST BIOPSY  Left          2011-b9      ?  HX CESAREAN SECTION    1989/1991      ?  HX DILATION AND CURETTAGE    2013      ?  HX HEENT                Family History  Problem  Relation  Age of Onset      ?  Hypertension  Mother        ?  Hypertension  Father              Social History            Socioeconomic History      ?  Marital status:  MARRIED          Spouse name:  Not on file      ?  Number of children:  Not on file      ?  Years of education:  Not on file      ?  Highest education level:  Not on file      Occupational History      ?  Not on file      Social Needs      ?  Financial resource strain:  Not on file      ?  Food insecurity:          Worry:  Not on file          Inability:  Not on file      ?  Transportation needs:          Medical:  Not on file          Non-medical:  Not on file      Tobacco Use      ?  Smoking status:  Never Smoker      ?  Smokeless tobacco:  Never Used      Substance and Sexual Activity      ?  Alcohol use:  Yes          Alcohol/week:  0.0 oz      ?  Drug use:  No      ?  Sexual activity:  Not on file      Lifestyle      ?  Physical activity:          Days per week:  Not on file          Minutes per session:  Not on file      ?  Stress:  Not on file      Relationships      ?  Social connections:          Talks on phone:  Not on file          Gets together:  Not on file           Attends religious service:  Not on file          Active member of club or organization:  Not on file          Attends meetings of clubs or organizations:  Not on file          Relationship status:  Not on file      ?  Intimate partner violence:          Fear of current or ex partner:  Not on file          Emotionally abused:  Not on file          Physically abused:  Not on file          Forced sexual activity:  Not on file      Other Topics  Concern      ?  Not on file      Social  History Narrative      ?  Not on file                  Allergies      Allergen  Reactions      ?  Reglan [Metoclopramide]  Other (comments)          Other reaction(s): Muscle ache    Spontaneous arm movements      ?  Diclofenac  Other (comments)          Elevated liver enzymes.      ?  Statins-Hmg-Coa Reductase Inhibitors  Nausea and Vomiting               Disposition                  No future appointments.              Current Medications after this visit           Current Outpatient Medications      Medication  Sig      ?  pantoprazole (PROTONIX) 40 mg tablet  Take 1 Tab by mouth daily. Indications: gastroesophageal reflux disease      ?  hydroxychloroquine (PLAQUENIL) 200 mg tablet  TAKE 1 TABLET TWICE A DAY      ?  VENTOLIN HFA 90 mcg/actuation inhaler  INHALE 1 PUFF BY MOUTH EVERY 4 HOURS AS NEEDED FOR WHEEZING      ?  aspirin delayed-release 81 mg tablet  Take 1 Tab by mouth daily.      ?  DULoxetine (CYMBALTA) 30 mg capsule  Take 1 Cap by mouth daily.      ?  lisinopril (PRINIVIL, ZESTRIL) 40 mg tablet  TAKE 1 TABLET DAILY      ?  pramipexole (MIRAPEX) 0.25 mg tablet  TAKE 1 TABLET TWICE A DAY      ?  metFORMIN ER (GLUCOPHAGE XR) 500 mg tablet  TAKE 2 TABLETS TWICE A DAY      ?  hydroCHLOROthiazide (HYDRODIURIL) 25 mg tablet  TAKE 1 TABLET DAILY      ?  loratadine (CLARITIN) 10 mg tablet  Take 1 Tab by mouth daily.      ?  fluticasone (FLONASE) 50 mcg/actuation nasal spray  2 sprays per nostril 2 times a day for 7 days,  then once a day during winter time.           No current facility-administered medications for this visit.            There are no discontinued medications.

## 2018-02-17 ENCOUNTER — Encounter

## 2018-03-02 ENCOUNTER — Ambulatory Visit: Payer: BLUE CROSS/BLUE SHIELD

## 2018-03-02 ENCOUNTER — Ambulatory Visit: Admit: 2018-03-02 | Discharge: 2018-03-02 | Payer: PRIVATE HEALTH INSURANCE | Attending: Family Medicine

## 2018-03-02 DIAGNOSIS — E119 Type 2 diabetes mellitus without complications: Secondary | ICD-10-CM

## 2018-03-02 NOTE — Progress Notes (Signed)
1. Have you been to the ER, urgent care clinic since your last visit?  Hospitalized since your last visit?No    2. Have you seen or consulted any other health care providers outside of the  Health System since your last visit?  Include any pap smears or colon screening. No  Reviewed record in preparation for visit and have necessary documentation  Pt did not bring medication to office visit for review  Information was given to pt on Advanced Directives, Living Will  Information was given on Shingles Vaccine  opportunity was given for questions  Goals that were addressed and/or need to be completed during or after this appointment include     Health Maintenance Due   Topic Date Due   ??? Pneumococcal 0-64 years (1 of 1 - PPSV23) 11/17/1963   ??? DTaP/Tdap/Td series (1 - Tdap) 11/17/1978   ??? Shingrix Vaccine Age 50> (1 of 2) 11/17/2007   ??? EYE EXAM RETINAL OR DILATED  01/25/2018   ??? HEMOGLOBIN A1C Q6M  03/05/2018

## 2018-03-02 NOTE — Patient Instructions (Signed)
A Healthy Lifestyle: Care Instructions  Your Care Instructions    A healthy lifestyle can help you feel good, stay at a healthy weight, and have plenty of energy for both work and play. A healthy lifestyle is something you can share with your whole family.  A healthy lifestyle also can lower your risk for serious health problems, such as high blood pressure, heart disease, and diabetes.  You can follow a few steps listed below to improve your health and the health of your family.  Follow-up care is a key part of your treatment and safety. Be sure to make and go to all appointments, and call your doctor if you are having problems. It's also a good idea to know your test results and keep a list of the medicines you take.  How can you care for yourself at home?  ?? Do not eat too much sugar, fat, or fast foods. You can still have dessert and treats now and then. The goal is moderation.  ?? Start small to improve your eating habits. Pay attention to portion sizes, drink less juice and soda pop, and eat more fruits and vegetables.  ? Eat a healthy amount of food. A 3-ounce serving of meat, for example, is about the size of a deck of cards. Fill the rest of your plate with vegetables and whole grains.  ? Limit the amount of soda and sports drinks you have every day. Drink more water when you are thirsty.  ? Eat at least 5 servings of fruits and vegetables every day. It may seem like a lot, but it is not hard to reach this goal. A serving or helping is 1 piece of fruit, 1 cup of vegetables, or 2 cups of leafy, raw vegetables. Have an apple or some carrot sticks as an afternoon snack instead of a candy bar. Try to have fruits and/or vegetables at every meal.  ?? Make exercise part of your daily routine. You may want to start with simple activities, such as walking, bicycling, or slow swimming. Try to be active 30 to 60 minutes every day. You do not need to do all 30 to 60  minutes all at once. For example, you can exercise 3 times a day for 10 or 20 minutes. Moderate exercise is safe for most people, but it is always a good idea to talk to your doctor before starting an exercise program.  ?? Keep moving. Mow the lawn, work in the garden, or clean your house. Take the stairs instead of the elevator at work.  ?? If you smoke, quit. People who smoke have an increased risk for heart attack, stroke, cancer, and other lung illnesses. Quitting is hard, but there are ways to boost your chance of quitting tobacco for good.  ? Use nicotine gum, patches, or lozenges.  ? Ask your doctor about stop-smoking programs and medicines.  ? Keep trying.  In addition to reducing your risk of diseases in the future, you will notice some benefits soon after you stop using tobacco. If you have shortness of breath or asthma symptoms, they will likely get better within a few weeks after you quit.  ?? Limit how much alcohol you drink. Moderate amounts of alcohol (up to 2 drinks a day for men, 1 drink a day for women) are okay. But drinking too much can lead to liver problems, high blood pressure, and other health problems.  Family health  If you have a family, there are many things you   can do together to improve your health.  ?? Eat meals together as a family as often as possible.  ?? Eat healthy foods. This includes fruits, vegetables, lean meats and dairy, and whole grains.  ?? Include your family in your fitness plan. Most people think of activities such as jogging or tennis as the way to fitness, but there are many ways you and your family can be more active. Anything that makes you breathe hard and gets your heart pumping is exercise. Here are some tips:  ? Walk to do errands or to take your child to school or the bus.  ? Go for a family bike ride after dinner instead of watching TV.  Where can you learn more?  Go to http://www.healthwise.net/[employer].   Enter U807 in the search box to learn more about "A Healthy Lifestyle: Care Instructions."  Current as of: May 27, 2017  Content Version: 11.9  ?? 2006-2018 [employer]. Care instructions adapted under license by [employer] (which disclaims liability or warranty for this information). If you have questions about a medical condition or this instruction, always ask your healthcare professional. Healthwise, Inc[employer] disclaims any warranty or liability for your use of this information.

## 2018-03-02 NOTE — Progress Notes (Signed)
CC: f/u DM and HTN    HPI: Pt is a 62 y.o. female who presents for f/u DM and HTN. She is doing well and reports that she is moving to [place] next week to be closer to her children.     HTN:  Checking BPs at home?: NO  Adding salt to foods?: Some  Headaches?: NO  Blurry vision?: NO  Lower extremity edema?: NO  Smoking?: NO    DM:  Checking BG at home?: NO  Insulin dependent?: NO  Other medications for DM?: Metformin ER 1000mg  BID  Symptoms of hypoglycemia?: NO  Aspirin?: YES  ACEi/ARB?: YES  Statin?: NO  Lipid lowering therapy not prescibed for patient reasons.  Last eye exam?: 04/2017 [facility]  Last foot exam?: 10/2017  Last A1c:   Lab Results   Component Value Date/Time    Hemoglobin A1c 6.4 (H) 09/04/2017 04:35 PM    Hemoglobin A1c (POC) 6.0 09/27/2015 10:44 AM     LastLDL:   Lab Results   Component Value Date/Time    LDL, calculated 90 09/04/2017 04:35 PM     Last microalbumin:   Lab Results   Component Value Date/Time    Microalb/Creat ratio (ug/mg creat.) 7.0 09/04/2017 04:35 PM           Past Medical History:   Diagnosis Date   ??? Anxiety    ??? Diabetes (HCC)    ??? Hypertension    ??? Lupus (HCC)    ??? Restless leg syndrome        Family History   Problem Relation Age of Onset   ??? Hypertension Mother    ??? Hypertension Father        Social History     Tobacco Use   ??? Smoking status: Never Smoker   ??? Smokeless tobacco: Never Used   Substance Use Topics   ??? Alcohol use: Yes     Alcohol/week: 0.0 oz   ??? Drug use: No       ROS:  Positive only when bolded  Constitutional: HA, F/C, changes in weight  Eyes: Changes in vision  Hematologic/lymphatic: LEE    PE:  Visit Vitals  BP 103/67 (BP 1 Location: Left arm, BP Patient Position: Sitting)   Pulse (!) 102   Temp 97.6 ??F (36.4 ??C) (Oral)   Resp 18   Ht 5\' 6"  (1.676 m)   Wt 237 lb (107.5 kg)   LMP 11/17/2014 Comment: premenapausal   SpO2 97%   BMI 38.25 kg/m??     Gen: Pt sitting in chair, in NAD  Head: Normocephalic, atraumatic  Eyes: Sclera anicteric, EOM grossly intact, PERRL   Throat: MMM, normal lips, tongue and gums  Neck: Supple, no LAD, no thyromegaly or carotid bruits  CVS: Normal S1, S2, no m/r/g  Resp: CTAB, no wheezes or rales  Extrem: Atraumatic, no cyanosis or edema  Pulses: 2+   Skin: Warm, dry  Neuro: Alert, oriented, appropriate      A/P: Pt is a 62 y.o. female who presents for f/u HTN and DM  - A1c  - Lipid panel  - CMP  - Pt advised to f/u with a new PCP in [place] once she is settled in and can request that we send her records there      Discussed diagnoses in detail with patient.   Medication risks/benefits/side effects discussed with patient.   All of the patient's questions were addressed. The patient understands and agrees with our plan of care.  The  patient knows to call back if they are unsure of or forget any changes we discussed today or if the symptoms change.  The patient received an After-Visit Summary which contains VS, orders, medication list and allergy list. This can be used as a "mini-medical record" should they have to seek medical care while out of town.    Current Outpatient Medications on File Prior to Visit   Medication Sig Dispense Refill   ??? pantoprazole (PROTONIX) 40 mg tablet Take 1 Tab by mouth daily. Indications: gastroesophageal reflux disease 56 Tab 0   ??? hydroxychloroquine (PLAQUENIL) 200 mg tablet TAKE 1 TABLET TWICE A DAY 180 Tab 1   ??? VENTOLIN HFA 90 mcg/actuation inhaler INHALE 1 PUFF BY MOUTH EVERY 4 HOURS AS NEEDED FOR WHEEZING 1 Inhaler 5   ??? aspirin delayed-release 81 mg tablet Take 1 Tab by mouth daily. 180 Tab 1   ??? DULoxetine (CYMBALTA) 30 mg capsule Take 1 Cap by mouth daily. 90 Cap 3   ??? lisinopril (PRINIVIL, ZESTRIL) 40 mg tablet TAKE 1 TABLET DAILY 90 Tab 1   ??? pramipexole (MIRAPEX) 0.25 mg tablet TAKE 1 TABLET TWICE A DAY 180 Tab 1   ??? metFORMIN ER (GLUCOPHAGE XR) 500 mg tablet TAKE 2 TABLETS TWICE A DAY 360 Tab 1   ??? hydroCHLOROthiazide (HYDRODIURIL) 25 mg tablet TAKE 1 TABLET DAILY 90 Tab 1    ??? loratadine (CLARITIN) 10 mg tablet Take 1 Tab by mouth daily. 90 Tab 3   ??? fluticasone (FLONASE) 50 mcg/actuation nasal spray 2 sprays per nostril 2 times a day for 7 days, then once a day during winter time. 1 Bottle 2     No current facility-administered medications on file prior to visit.

## 2018-03-02 NOTE — Progress Notes (Signed)
CC: f/u DM and HTN    HPI: Pt is a 62 y.o. female who presents for f/u DM and HTN. She is doing well and reports that she is moving to [place] next week to be closer to her children.     HTN:  Checking BPs at home?: NO  Adding salt to foods?: Some  Headaches?: NO  Blurry vision?: NO  Lower extremity edema?: NO  Smoking?: NO    DM:  Checking BG at home?: NO  Insulin dependent?: NO  Other medications for DM?: Metformin ER 1000mg  BID  Symptoms of hypoglycemia?: NO  Aspirin?: YES  ACEi/ARB?: YES  Statin?: NO  Lipid lowering therapy not prescibed for patient reasons.  Last eye exam?: 04/2017 [facility]  Last foot exam?: 10/2017  Last A1c:   Lab Results   Component Value Date/Time    Hemoglobin A1c 6.4 (H) 09/04/2017 04:35 PM    Hemoglobin A1c (POC) 6.0 09/27/2015 10:44 AM     LastLDL:   Lab Results   Component Value Date/Time    LDL, calculated 90 09/04/2017 04:35 PM     Last microalbumin:   Lab Results   Component Value Date/Time    Microalb/Creat ratio (ug/mg creat.) 7.0 09/04/2017 04:35 PM           Past Medical History:   Diagnosis Date   ??? Anxiety    ??? Diabetes (HCC)    ??? Hypertension    ??? Lupus (HCC)    ??? Restless leg syndrome        Family History   Problem Relation Age of Onset   ??? Hypertension Mother    ??? Hypertension Father        Social History     Tobacco Use   ??? Smoking status: Never Smoker   ??? Smokeless tobacco: Never Used   Substance Use Topics   ??? Alcohol use: Yes     Alcohol/week: 0.0 oz   ??? Drug use: No       ROS:  Positive only when bolded  Constitutional: HA, F/C, changes in weight  Eyes: Changes in vision  Hematologic/lymphatic: LEE    PE:  Visit Vitals  BP 103/67 (BP 1 Location: Left arm, BP Patient Position: Sitting)   Pulse (!) 102   Temp 97.6 ??F (36.4 ??C) (Oral)   Resp 18   Ht 5\' 6"  (1.676 m)   Wt 237 lb (107.5 kg)   LMP 11/17/2014 Comment: premenapausal   SpO2 97%   BMI 38.25 kg/m??     Gen: Pt sitting in chair, in NAD  Head: Normocephalic, atraumatic  Eyes: Sclera anicteric, EOM grossly intact, PERRL  Throat:  MMM, normal lips, tongue and gums  Neck: Supple, no LAD, no thyromegaly or carotid bruits  CVS: Normal S1, S2, no m/r/g  Resp: CTAB, no wheezes or rales  Extrem: Atraumatic, no cyanosis or edema  Pulses: 2+   Skin: Warm, dry  Neuro: Alert, oriented, appropriate      A/P: Pt is a 62 y.o. female who presents for f/u HTN and DM  - A1c  - Lipid panel  - CMP  - Pt advised to f/u with a new PCP in [place] once she is settled in and can request that we send her records there      Discussed diagnoses in detail with patient.   Medication risks/benefits/side effects discussed with patient.   All of the patient's questions were addressed. The patient understands and agrees with our plan of care.  The  patient knows to call back if they are unsure of or forget any changes we discussed today or if the symptoms change.  The patient received an After-Visit Summary which contains VS, orders, medication list and allergy list. This can be used as a "mini-medical record" should they have to seek medical care while out of town.    Current Outpatient Medications on File Prior to Visit   Medication Sig Dispense Refill   ??? pantoprazole (PROTONIX) 40 mg tablet Take 1 Tab by mouth daily. Indications: gastroesophageal reflux disease 56 Tab 0   ??? hydroxychloroquine (PLAQUENIL) 200 mg tablet TAKE 1 TABLET TWICE A DAY 180 Tab 1   ??? VENTOLIN HFA 90 mcg/actuation inhaler INHALE 1 PUFF BY MOUTH EVERY 4 HOURS AS NEEDED FOR WHEEZING 1 Inhaler 5   ??? aspirin delayed-release 81 mg tablet Take 1 Tab by mouth daily. 180 Tab 1   ??? DULoxetine (CYMBALTA) 30 mg capsule Take 1 Cap by mouth daily. 90 Cap 3   ??? lisinopril (PRINIVIL, ZESTRIL) 40 mg tablet TAKE 1 TABLET DAILY 90 Tab 1   ??? pramipexole (MIRAPEX) 0.25 mg tablet TAKE 1 TABLET TWICE A DAY 180 Tab 1   ??? metFORMIN ER (GLUCOPHAGE XR) 500 mg tablet TAKE 2 TABLETS TWICE A DAY 360 Tab 1   ??? hydroCHLOROthiazide (HYDRODIURIL) 25 mg tablet TAKE 1 TABLET DAILY 90 Tab 1   ??? loratadine (CLARITIN) 10 mg tablet Take 1  Tab by mouth daily. 90 Tab 3   ??? fluticasone (FLONASE) 50 mcg/actuation nasal spray 2 sprays per nostril 2 times a day for 7 days, then once a day during winter time. 1 Bottle 2     No current facility-administered medications on file prior to visit.

## 2018-03-02 NOTE — Progress Notes (Signed)
 1. Have you been to the ER, urgent care clinic since your last visit?  Hospitalized since your last visit?No    2. Have you seen or consulted any other health care providers outside of the [facility] since your last visit?  Include any pap smears or colon screening. No  Reviewed record in preparation for visit and have necessary documentation  Pt did not bring medication to office visit for review  Information was given to pt on Advanced Directives, Living Will  Information was given on Shingles Vaccine  opportunity was given for questions  Goals that were addressed and/or need to be completed during or after this appointment include     Health Maintenance Due   Topic Date Due   . Pneumococcal 0-64 years (1 of 1 - PPSV23) 11/17/1961   . DTaP/Tdap/Td series (1 - Tdap) 11/17/1978   . Shingrix Vaccine Age 88> (1 of 2) 11/17/2007   . EYE EXAM RETINAL OR DILATED  01/25/2018   . HEMOGLOBIN A1C Q6M  03/05/2018

## 2018-03-03 LAB — COMPREHENSIVE METABOLIC PANEL
ALT: 33 IU/L — ABNORMAL HIGH (ref 0–32)
AST: 24 IU/L (ref 0–40)
Albumin/Globulin Ratio: 2.3 NA — ABNORMAL HIGH (ref 1.2–2.2)
Albumin: 4.6 g/dL (ref 3.6–4.8)
Alkaline Phosphatase: 51 IU/L (ref 39–117)
BUN: 14 mg/dL (ref 8–27)
Bun/Cre Ratio: 19 NA (ref 12–28)
CO2: 27 mmol/L (ref 20–29)
Calcium: 9.8 mg/dL (ref 8.7–10.3)
Chloride: 101 mmol/L (ref 96–106)
Creatinine: 0.74 mg/dL (ref 0.57–1.00)
EGFR IF NonAfrican American: 88 mL/min/{1.73_m2} (ref >59)
GFR African American: 102 mL/min/{1.73_m2} (ref >59)
Globulin, Total: 2 g/dL (ref 1.5–4.5)
Glucose: 95 mg/dL (ref 65–99)
Potassium: 4.2 mmol/L (ref 3.5–5.2)
Sodium: 144 mmol/L (ref 134–144)
Total Bilirubin: <0.2 mg/dL (ref 0.0–1.2)
Total Protein: 6.6 g/dL (ref 6.0–8.5)

## 2018-03-03 LAB — LIPID PANEL
Cholesterol, Total: 207 mg/dL — ABNORMAL HIGH (ref 100–199)
Cholesterol, total: 207 mg/dL — ABNORMAL HIGH (ref 100–199)
HDL Cholesterol: 64 mg/dL (ref >39)
HDL: 64 mg/dL (ref >39)
LDL Calculated: 104 mg/dL — ABNORMAL HIGH (ref 0–99)
LDL, calculated: 104 mg/dL — ABNORMAL HIGH (ref 0–99)
Triglyceride: 197 mg/dL — ABNORMAL HIGH (ref 0–149)
Triglycerides: 197 mg/dL — ABNORMAL HIGH (ref 0–149)
VLDL Cholesterol Calculated: 39 mg/dL (ref 5–40)
VLDL, calculated: 39 mg/dL (ref 5–40)

## 2018-03-03 LAB — HEMOGLOBIN A1C W/EAG
Hemoglobin A1C: 6.3 % — ABNORMAL HIGH (ref 4.8–5.6)
eAG: 134 mg/dL

## 2018-03-03 LAB — METABOLIC PANEL, COMPREHENSIVE
A-G Ratio: 2.3 — ABNORMAL HIGH (ref 1.2–2.2)
ALT (SGPT): 33 IU/L — ABNORMAL HIGH (ref 0–32)
AST (SGOT): 24 IU/L (ref 0–40)
Albumin: 4.6 g/dL (ref 3.6–4.8)
Alk. phosphatase: 51 IU/L (ref 39–117)
BUN/Creatinine ratio: 19 (ref 12–28)
BUN: 14 mg/dL (ref 8–27)
Bilirubin, total: <0.2 mg/dL (ref 0.0–1.2)
CO2: 27 mmol/L (ref 20–29)
Calcium: 9.8 mg/dL (ref 8.7–10.3)
Chloride: 101 mmol/L (ref 96–106)
Creatinine: 0.74 mg/dL (ref 0.57–1.00)
GFR est AA: 102 mL/min/{1.73_m2} (ref >59)
GFR est non-AA: 88 mL/min/{1.73_m2} (ref >59)
GLOBULIN, TOTAL: 2 g/dL (ref 1.5–4.5)
Glucose: 95 mg/dL (ref 65–99)
Potassium: 4.2 mmol/L (ref 3.5–5.2)
Protein, total: 6.6 g/dL (ref 6.0–8.5)
Sodium: 144 mmol/L (ref 134–144)

## 2018-03-03 LAB — HEMOGLOBIN A1C WITH EAG
Estimated average glucose: 134 mg/dL
Hemoglobin A1c: 6.3 % — ABNORMAL HIGH (ref 4.8–5.6)

## 2018-03-03 NOTE — Telephone Encounter (Signed)
Called to inform pt of recent results. A1c slightly improved from last time, now at 6.3. Cholesterol is stable, and she is not able to tolerate statins, so continue working on diet and lifestyle modifications to bring this down. Her ALT is just above the normal range. She is asymptomatic, however this is new. Recommend that she have it checked with her new PCP in about 6 months or at her next visit, unless she develops abdominal pain or other new symptoms, in which case she should be evaluated sooner. Will send her a copy of the lab results to bring with her. All questions answered and pt feels comfortable with the plan of care.

## 2018-04-26 ENCOUNTER — Encounter

## 2018-04-28 MED ORDER — HYDROXYCHLOROQUINE 200 MG TAB
200 mg | ORAL_TABLET | ORAL | 1 refills | Status: DC
Start: 2018-04-28 — End: 2018-05-22

## 2018-04-28 MED ORDER — HYDROCHLOROTHIAZIDE 25 MG TAB
25 mg | ORAL_TABLET | ORAL | 1 refills | Status: DC
Start: 2018-04-28 — End: 2018-05-22

## 2018-04-28 MED ORDER — DULOXETINE 30 MG CAP, DELAYED RELEASE
30 mg | ORAL_CAPSULE | Freq: Every day | ORAL | 3 refills | Status: DC
Start: 2018-04-28 — End: 2018-05-22

## 2018-04-28 MED ORDER — LISINOPRIL 40 MG TAB
40 mg | ORAL_TABLET | ORAL | 1 refills | Status: DC
Start: 2018-04-28 — End: 2018-05-22

## 2018-04-28 MED ORDER — PRAMIPEXOLE 0.25 MG TAB
0.25 mg | ORAL_TABLET | ORAL | 1 refills | Status: AC
Start: 2018-04-28 — End: ?

## 2018-04-28 MED ORDER — METFORMIN SR 500 MG 24 HR TABLET
500 mg | ORAL_TABLET | ORAL | 1 refills | Status: DC
Start: 2018-04-28 — End: 2018-05-22

## 2018-04-29 NOTE — Telephone Encounter (Signed)
Call made to pt, no answer, mess left.    Pt's medications were filled at [facility] on 04/27/18. Please get meds there. If pt wants meds to go to another pharmacy for next refill, please call that pharmacy to request the meds

## 2018-05-08 NOTE — Telephone Encounter (Signed)
These prescriptions were refilled on 04/27/18.  Two went to a local pharmacy and the others went to [facility] which is the mail order pharmacy listed in patient's chart.  Attempted to call patient to verify but there was no answer.

## 2018-05-08 NOTE — Telephone Encounter (Signed)
-----   Message from Winnifred Friarandice A Gaines sent at 05/08/2018  3:41 PM EDT -----  Regarding: Dr.Esquivel/Refill   Medication Refill    Caller (if not patient): Truett Pernanika, Front pharmacy clerk with [facility]       Relationship of caller (if not patient):      Best contact number(s):[phone]      Name of medication and dosage if known: 90 day refills 1.Duloxetine 30mg  2.Plaquexil 200mg  3.Lisinopril 40mg " 4.Hydrochlothazide 25mg  5.Metformin 500mg       Is patient out of this medication (yes/no):Yes      Pharmacy name:[facility]     Pharmacy listed in chart? (yes/no):Yes  Pharmacy phone number: [phone] or Fax" [phone]      Details to clarify the request: Leanord AsalOnika stated this is the 3rd attempt for the refill (04/23/18 and 04/28/18)      Candice A Floyce StakesGaines

## 2018-05-11 NOTE — Telephone Encounter (Signed)
Left message for patient to call.

## 2018-05-22 ENCOUNTER — Encounter

## 2018-05-22 NOTE — Telephone Encounter (Signed)
-----   Message from Dayna Ramus sent at 05/22/2018 11:47 AM EDT -----  Regarding: Dr. Sanda Klein Refill   General Message/Vendor Calls    Caller's first and last name: [facility]       Reason for call: Pt is needing a refill on Metformin 500 mg, Hydrochlorothiazide 25mg , Lisinopril 40mg , "Plaquenil" 200mg , and Duloxetine 30mg        Callback required yes/no and why: YES       Best contact number(s): [phone]      Details to clarify the request:      Dayna Ramus

## 2018-05-23 ENCOUNTER — Encounter

## 2018-05-26 MED ORDER — LISINOPRIL 40 MG TAB
40 mg | ORAL_TABLET | ORAL | 1 refills | Status: AC
Start: 2018-05-26 — End: ?

## 2018-05-26 MED ORDER — HYDROXYCHLOROQUINE 200 MG TAB
200 mg | ORAL_TABLET | ORAL | 1 refills | Status: AC
Start: 2018-05-26 — End: ?

## 2018-05-26 MED ORDER — METFORMIN SR 500 MG 24 HR TABLET
500 mg | ORAL_TABLET | ORAL | 1 refills | Status: AC
Start: 2018-05-26 — End: ?

## 2018-05-26 MED ORDER — HYDROCHLOROTHIAZIDE 25 MG TAB
25 mg | ORAL_TABLET | ORAL | 1 refills | Status: AC
Start: 2018-05-26 — End: ?

## 2018-05-26 MED ORDER — DULOXETINE 30 MG CAP, DELAYED RELEASE
30 mg | ORAL_CAPSULE | Freq: Every day | ORAL | 3 refills | Status: AC
Start: 2018-05-26 — End: ?

## 2019-01-16 ENCOUNTER — Encounter

## 2019-01-18 NOTE — Telephone Encounter (Signed)
Attempted to call. Unsuccessful. Voice mail box full.  Need to clarify if she is still a patient at this clinic if so needs virtual appoitment

## 2019-01-18 NOTE — Telephone Encounter (Signed)
Can we call to figure out if she is still a patient here? At her last visit in 02/2018 she said she was moving to [place]. If she is still planning on coming here, could we get her an in-person visit for f/u chronic conditions and labs!

## 2019-01-22 NOTE — Telephone Encounter (Signed)
Number no longer in service

## 2019-01-22 NOTE — Telephone Encounter (Signed)
Phone no longer in service to clarify if this office is still her PCP office.

## 2019-02-01 ENCOUNTER — Encounter

## 2019-03-25 ENCOUNTER — Encounter

## 2019-04-10 ENCOUNTER — Encounter

## 2019-05-18 ENCOUNTER — Encounter

## 2022-07-26 ENCOUNTER — Emergency Department (HOSPITAL_BASED_OUTPATIENT_CLINIC_OR_DEPARTMENT_OTHER): Payer: BLUE CROSS/BLUE SHIELD

## 2022-07-26 ENCOUNTER — Other Ambulatory Visit: Payer: Self-pay

## 2022-07-26 ENCOUNTER — Emergency Department (HOSPITAL_BASED_OUTPATIENT_CLINIC_OR_DEPARTMENT_OTHER)
Admission: EM | Admit: 2022-07-26 | Discharge: 2022-07-26 | Disposition: A | Discharge status: Home / Self Care | Payer: BLUE CROSS/BLUE SHIELD | Attending: Emergency Medicine | Admitting: Emergency Medicine

## 2022-07-26 ENCOUNTER — Encounter (HOSPITAL_BASED_OUTPATIENT_CLINIC_OR_DEPARTMENT_OTHER): Payer: Self-pay | Admitting: Emergency Medicine

## 2022-07-26 DIAGNOSIS — K5090 Crohn's disease, unspecified, without complications: Secondary | ICD-10-CM | POA: Diagnosis not present

## 2022-07-26 DIAGNOSIS — Z1152 Encounter for screening for COVID-19: Secondary | ICD-10-CM | POA: Diagnosis not present

## 2022-07-26 DIAGNOSIS — Z79899 Other long term (current) drug therapy: Secondary | ICD-10-CM | POA: Diagnosis not present

## 2022-07-26 DIAGNOSIS — E119 Type 2 diabetes mellitus without complications: Secondary | ICD-10-CM | POA: Diagnosis not present

## 2022-07-26 DIAGNOSIS — J90 Pleural effusion, not elsewhere classified: Secondary | ICD-10-CM | POA: Insufficient documentation

## 2022-07-26 DIAGNOSIS — R0602 Shortness of breath: Secondary | ICD-10-CM | POA: Diagnosis present

## 2022-07-26 DIAGNOSIS — I10 Essential (primary) hypertension: Secondary | ICD-10-CM | POA: Diagnosis not present

## 2022-07-26 DIAGNOSIS — J810 Acute pulmonary edema: Secondary | ICD-10-CM | POA: Diagnosis not present

## 2022-07-26 HISTORY — DX: Reserved for concepts with insufficient information to code with codable children: IMO0002

## 2022-07-26 HISTORY — DX: Crohn's disease, unspecified, without complications: K50.90

## 2022-07-26 HISTORY — DX: Type 2 diabetes mellitus without complications: E11.9

## 2022-07-26 LAB — COMPREHENSIVE METABOLIC PANEL
ALT: 37 U/L (ref 0–44)
AST: 25 U/L (ref 15–41)
Albumin: 4.1 g/dL (ref 3.5–5.0)
Alkaline Phosphatase: 48 U/L (ref 38–126)
Anion gap: 6 (ref 5–15)
BUN: 13 mg/dL (ref 8–23)
CO2: 29 mmol/L (ref 22–32)
Calcium: 8.7 mg/dL — ABNORMAL LOW (ref 8.9–10.3)
Chloride: 108 mmol/L (ref 98–111)
Creatinine, Ser: 0.69 mg/dL (ref 0.44–1.00)
GFR, Estimated: >60 mL/min (ref >60)
Glucose, Bld: 94 mg/dL (ref 70–99)
Potassium: 3.6 mmol/L (ref 3.5–5.1)
Sodium: 143 mmol/L (ref 135–145)
Total Bilirubin: 0.4 mg/dL (ref 0.3–1.2)
Total Protein: 6.9 g/dL (ref 6.5–8.1)

## 2022-07-26 LAB — URINALYSIS, MICROSCOPIC (REFLEX)

## 2022-07-26 LAB — URINALYSIS, ROUTINE W REFLEX MICROSCOPIC
Bilirubin Urine: NEGATIVE
Glucose, UA: >=500 mg/dL — AB
Hgb urine dipstick: NEGATIVE
Ketones, ur: NEGATIVE mg/dL
Nitrite: NEGATIVE
Protein, ur: NEGATIVE mg/dL
Specific Gravity, Urine: 1.015 (ref 1.005–1.030)
pH: 6 (ref 5.0–8.0)

## 2022-07-26 LAB — CBC WITH DIFFERENTIAL/PLATELET
Abs Immature Granulocytes: 0.03 10*3/uL (ref 0.00–0.07)
Basophils Absolute: 0.1 10*3/uL (ref 0.0–0.1)
Basophils Relative: 1 %
Eosinophils Absolute: 0.2 10*3/uL (ref 0.0–0.5)
Eosinophils Relative: 3 %
HCT: 40.6 % (ref 36.0–46.0)
Hemoglobin: 13.1 g/dL (ref 12.0–15.0)
Immature Granulocytes: 0 %
Lymphocytes Relative: 22 %
Lymphs Abs: 1.6 10*3/uL (ref 0.7–4.0)
MCH: 31 pg (ref 26.0–34.0)
MCHC: 32.3 g/dL (ref 30.0–36.0)
MCV: 96 fL (ref 80.0–100.0)
Monocytes Absolute: 0.6 10*3/uL (ref 0.1–1.0)
Monocytes Relative: 8 %
Neutro Abs: 4.7 10*3/uL (ref 1.7–7.7)
Neutrophils Relative %: 66 %
Platelets: 204 10*3/uL (ref 150–400)
RBC: 4.23 MIL/uL (ref 3.87–5.11)
RDW: 13.3 % (ref 11.5–15.5)
WBC: 7.1 10*3/uL (ref 4.0–10.5)
nRBC: 0 % (ref 0.0–0.2)

## 2022-07-26 LAB — I-STAT VENOUS BLOOD GAS, ED
Acid-Base Excess: 3 mmol/L — ABNORMAL HIGH (ref 0.0–2.0)
Bicarbonate: 27.5 mmol/L (ref 20.0–28.0)
Calcium, Ion: 1.13 mmol/L — ABNORMAL LOW (ref 1.15–1.40)
HCT: 41 % (ref 36.0–46.0)
Hemoglobin: 13.9 g/dL (ref 12.0–15.0)
O2 Saturation: 49 %
Potassium: 3.6 mmol/L (ref 3.5–5.1)
Sodium: 142 mmol/L (ref 135–145)
TCO2: 29 mmol/L (ref 22–32)
pCO2, Ven: 42.6 mmHg — ABNORMAL LOW (ref 44–60)
pH, Ven: 7.418 (ref 7.25–7.43)
pO2, Ven: 26 mmHg — CL (ref 32–45)

## 2022-07-26 LAB — D-DIMER, QUANTITATIVE: D-Dimer, Quant: 0.89 ug/mL-FEU — ABNORMAL HIGH (ref 0.00–0.50)

## 2022-07-26 LAB — BRAIN NATRIURETIC PEPTIDE: B Natriuretic Peptide: 79.2 pg/mL (ref 0.0–100.0)

## 2022-07-26 LAB — RESP PANEL BY RT-PCR (FLU A&B, COVID) ARPGX2
Influenza A by PCR: NEGATIVE
Influenza B by PCR: NEGATIVE
SARS Coronavirus 2 by RT PCR: NEGATIVE

## 2022-07-26 MED ORDER — FUROSEMIDE 10 MG/ML IJ SOLN
20.0000 mg | Freq: Once | INTRAMUSCULAR | Status: AC
  Administered 2022-07-26: 20 mg via INTRAVENOUS
  Filled 2022-07-26: qty 2

## 2022-07-26 MED ORDER — FUROSEMIDE 20 MG PO TABS: 20.0000 mg | ORAL_TABLET | Freq: Every day | ORAL | 0 refills | Status: AC | PRN

## 2022-07-26 MED ORDER — LISINOPRIL-HYDROCHLOROTHIAZIDE 20-12.5 MG PO TABS: 2.0000 | ORAL_TABLET | Freq: Every day | ORAL | 0 refills | Status: AC

## 2022-07-26 MED ORDER — IOHEXOL 350 MG/ML SOLN
75.0000 mL | Freq: Once | INTRAVENOUS | Status: AC | PRN
  Administered 2022-07-26: 75 mL via INTRAVENOUS

## 2022-07-26 NOTE — ED Notes (Signed)
Bedside commode and urine hat placed at bedside for strict I&O. Pt aware that we are measuring her urine at this time.

## 2022-07-26 NOTE — ED Provider Notes (Signed)
[facility] EMERGENCY DEPARTMENT Provider Note   CSN: [id number] Arrival date & time: 07/26/22  1453     History {Add pertinent medical, surgical, social history, OB history to HPI:1} Chief Complaint  Patient presents with  . Shortness of Breath    Amanda Gilbert is a 66 y.o. female with a past medical history of diabetes, Crohn's disease, lupus, and hypertension who presents to the emergency department with shortness of breath.  Patient reports that Monday she began having flulike symptoms.  She took a COVID test this past Tuesday which was negative.  Yesterday she began feeling very short of breath when exerting herself.  This morning when she awoke she placed a pulse ox on her finger and noted that her oxygen saturation was 85% on room air.  She reports that over the past 2 days her symptoms have progressively worsened.  She notes that if she walks down the hall of her house she is very out of breath and has to stop to catch it which is brand-new.  She was recently taken off of her lisinopril/HCTZ due to a dosing error.  She has noted increased swelling in her legs but more so on the left which she thinks is new.  She has a history of bilateral saphenous vein removal.  She denies a history of CHF, pulmonary edema, lupus pericarditis, or lupus nephritis.   Shortness of Breath      Home Medications Prior to Admission medications   Not on File      Allergies    Diclofenac, Metoclopramide, and Statins    Review of Systems   Review of Systems  Respiratory:  Positive for shortness of breath.     Physical Exam Updated Vital Signs BP (!) 151/68   Pulse 73   Temp 98.4 F (36.9 C) (Oral)   Resp 16   Ht 5\' 4"  (1.626 m)   Wt 111.1 kg   SpO2 94%   BMI 42.05 kg/m  Physical Exam Vitals and nursing note reviewed.  Constitutional:      General: She is not in acute distress.    Appearance: She is well-developed. She is not diaphoretic.  HENT:     Head: Normocephalic  and atraumatic.     Right Ear: External ear normal.     Left Ear: External ear normal.     Nose: Nose normal.     Mouth/Throat:     Mouth: Mucous membranes are moist.  Eyes:     General: No scleral icterus.    Conjunctiva/sclera: Conjunctivae normal.  Cardiovascular:     Rate and Rhythm: Normal rate and regular rhythm.     Heart sounds: Normal heart sounds. No murmur heard.    No friction rub. No gallop.  Pulmonary:     Effort: Pulmonary effort is normal. No respiratory distress.     Breath sounds: Normal breath sounds.  Abdominal:     General: Bowel sounds are normal. There is no distension.     Palpations: Abdomen is soft. There is no mass.     Tenderness: There is no abdominal tenderness. There is no guarding.  Musculoskeletal:     Cervical back: Normal range of motion.     Left lower leg: Edema present.  Skin:    General: Skin is warm and dry.  Neurological:     Mental Status: She is alert and oriented to person, place, and time.  Psychiatric:        Behavior: Behavior normal.  ED Results / Procedures / Treatments   Labs (all labs ordered are listed, but only abnormal results are displayed) Labs Reviewed  RESP PANEL BY RT-PCR (FLU A&B, COVID) ARPGX2  CBC WITH DIFFERENTIAL/PLATELET  BRAIN NATRIURETIC PEPTIDE  COMPREHENSIVE METABOLIC PANEL  D-DIMER, QUANTITATIVE  URINALYSIS, ROUTINE W REFLEX MICROSCOPIC  I-STAT VENOUS BLOOD GAS, ED    EKG None  Radiology DG Chest 2 View  Result Date: 07/26/2022 CLINICAL DATA:  Short of breath, headache, congestion, diarrhea EXAM: CHEST - 2 VIEW COMPARISON:  04/06/2009 FINDINGS: Frontal and lateral views of the chest demonstrate borderline enlargement the cardiac silhouette. The lungs are hyperinflated, with prominent central vascular congestion and diffuse interstitial prominence. No focal consolidation, effusion, or pneumothorax. No acute bony abnormalities. IMPRESSION: 1. Findings consistent with mild congestive heart  failure and interstitial edema. Electronically Signed   By: Sharlet Salina M.D.   On: 07/26/2022 15:24    Procedures Procedures  {Document cardiac monitor, telemetry assessment procedure when appropriate:1}  Medications Ordered in ED Medications - No data to display  ED Course/ Medical Decision Making/ A&P Clinical Course as of 07/26/22 1845  Fri Jul 26, 2022  1844 ED EKG I visualized EKG which shows normal sinus rhythm without signs of ischemia [AH]  1845 DG Chest 2 View Visualized and interpreted chest x-ray which shows mild pulmonary edema with costophrenic angle blunting likely due to mild edema [AH]    Clinical Course User Index [AH] Arthor Captain, PA-C                           Medical Decision Making Amount and/or Complexity of Data Reviewed Labs: ordered. Radiology: ordered.   ***  {Document critical care time when appropriate:1} {Document review of labs and clinical decision tools ie heart score, Chads2Vasc2 etc:1}  {Document your independent review of radiology images, and any outside records:1} {Document your discussion with family members, caretakers, and with consultants:1} {Document social determinants of health affecting pt's care:1} {Document your decision making why or why not admission, treatments were needed:1} Final Clinical Impression(s) / ED Diagnoses Final diagnoses:  None    Rx / DC Orders ED Discharge Orders     None

## 2022-07-26 NOTE — ED Notes (Signed)
Attempt PIV unsuccessful x, to be tried using u/s

## 2022-07-26 NOTE — ED Notes (Signed)
Ambulated pt around nursing desks, pt started at 96%, decreased to 92% with HR of 116 at the peak. Pt expressed "being short of breath" while ambulating. After a few moments sitting on bed, SpO2 was 92% with a HR of 85. Pt noted that her SpO2 "typically gets into the 80's after walking"

## 2022-07-26 NOTE — ED Notes (Signed)
Pt ambulated to bathroom with mild shortness of breath, sat 92% rm air

## 2022-07-26 NOTE — ED Notes (Addendum)
Pt given food and water per provider okay. Pt will also take her nighttime medications per provider okay (Mirapex, Plaquenil, and Cymbalta).

## 2022-07-26 NOTE — Discharge Instructions (Signed)
Contact a health care provider if: You have a dry, hacking cough. You are gaining weight gradually. Your shortness of breath gets worse with activity. You have more swelling in your hands, feet, ankles, or abdomen. You have difficulty sleeping because it is hard to breathe. Your blood pressure is higher than 180/120.

## 2022-07-26 NOTE — ED Triage Notes (Signed)
Headache, congestion, diarrhea and shob since Monday. Denies cough, n/v, fevers.

## 2022-08-06 ENCOUNTER — Ambulatory Visit: Payer: BLUE CROSS/BLUE SHIELD | Admitting: Internal Medicine
